# Patient Record
Sex: Male | Born: 1960 | Race: Black or African American | Hispanic: No | Marital: Married | Smoking: Former smoker
Health system: Southern US, Community
[De-identification: ages and names within clinical notes are randomized; demographics above are authoritative.]

## PROBLEM LIST (undated history)

## (undated) DIAGNOSIS — Z21 Asymptomatic human immunodeficiency virus [HIV] infection status: Secondary | ICD-10-CM

## (undated) DIAGNOSIS — B20 Human immunodeficiency virus [HIV] disease: Secondary | ICD-10-CM

## (undated) DIAGNOSIS — E785 Hyperlipidemia, unspecified: Secondary | ICD-10-CM

## (undated) HISTORY — DX: Asymptomatic human immunodeficiency virus (hiv) infection status: Z21

## (undated) HISTORY — DX: Hyperlipidemia, unspecified: E78.5

## (undated) HISTORY — DX: Human immunodeficiency virus (HIV) disease: B20

---

## 2001-06-28 ENCOUNTER — Ambulatory Visit (HOSPITAL_COMMUNITY): Admission: RE | Admit: 2001-06-28 | Discharge: 2001-06-28 | Payer: Self-pay | Admitting: General Surgery

## 2004-05-09 ENCOUNTER — Ambulatory Visit (HOSPITAL_COMMUNITY): Admission: RE | Admit: 2004-05-09 | Discharge: 2004-05-09 | Payer: Self-pay | Admitting: Family Medicine

## 2004-05-13 ENCOUNTER — Ambulatory Visit (HOSPITAL_COMMUNITY): Admission: RE | Admit: 2004-05-13 | Discharge: 2004-05-13 | Payer: Self-pay | Admitting: Family Medicine

## 2004-06-20 ENCOUNTER — Ambulatory Visit (HOSPITAL_COMMUNITY): Admission: RE | Admit: 2004-06-20 | Discharge: 2004-06-20 | Payer: Self-pay | Admitting: Thoracic Surgery

## 2004-06-28 ENCOUNTER — Ambulatory Visit: Payer: Self-pay | Admitting: Internal Medicine

## 2004-06-30 HISTORY — PX: COLONOSCOPY: SHX174

## 2004-07-01 ENCOUNTER — Ambulatory Visit: Payer: Self-pay | Admitting: Internal Medicine

## 2004-07-01 ENCOUNTER — Ambulatory Visit (HOSPITAL_COMMUNITY): Admission: RE | Admit: 2004-07-01 | Discharge: 2004-07-01 | Payer: Self-pay | Admitting: Internal Medicine

## 2004-08-05 ENCOUNTER — Ambulatory Visit: Payer: Self-pay | Admitting: Internal Medicine

## 2004-10-07 ENCOUNTER — Ambulatory Visit (HOSPITAL_COMMUNITY): Admission: RE | Admit: 2004-10-07 | Discharge: 2004-10-07 | Payer: Self-pay | Admitting: Family Medicine

## 2004-10-29 ENCOUNTER — Encounter (INDEPENDENT_AMBULATORY_CARE_PROVIDER_SITE_OTHER): Payer: Self-pay | Admitting: *Deleted

## 2004-10-29 ENCOUNTER — Encounter: Payer: Self-pay | Admitting: Internal Medicine

## 2004-10-29 DIAGNOSIS — B59 Pneumocystosis: Secondary | ICD-10-CM

## 2004-10-29 LAB — CONVERTED CEMR LAB
CD4 Count: 9 microliters
CD4 T Cell Abs: 9

## 2004-10-31 ENCOUNTER — Ambulatory Visit (HOSPITAL_COMMUNITY): Admission: RE | Admit: 2004-10-31 | Discharge: 2004-10-31 | Payer: Self-pay | Admitting: Family Medicine

## 2004-11-05 ENCOUNTER — Ambulatory Visit (HOSPITAL_COMMUNITY): Admission: RE | Admit: 2004-11-05 | Discharge: 2004-11-05 | Payer: Self-pay | Admitting: Family Medicine

## 2004-11-07 ENCOUNTER — Inpatient Hospital Stay (HOSPITAL_COMMUNITY): Admission: AD | Admit: 2004-11-07 | Discharge: 2004-11-15 | Payer: Self-pay | Admitting: Family Medicine

## 2004-12-28 ENCOUNTER — Ambulatory Visit: Payer: Self-pay | Admitting: Internal Medicine

## 2004-12-28 ENCOUNTER — Ambulatory Visit (HOSPITAL_COMMUNITY): Admission: RE | Admit: 2004-12-28 | Discharge: 2004-12-28 | Payer: Self-pay | Admitting: Internal Medicine

## 2005-01-24 ENCOUNTER — Ambulatory Visit: Payer: Self-pay | Admitting: Internal Medicine

## 2005-04-10 ENCOUNTER — Ambulatory Visit (HOSPITAL_COMMUNITY): Admission: RE | Admit: 2005-04-10 | Discharge: 2005-04-10 | Payer: Self-pay | Admitting: Internal Medicine

## 2005-04-10 ENCOUNTER — Ambulatory Visit: Payer: Self-pay | Admitting: Internal Medicine

## 2005-04-24 ENCOUNTER — Ambulatory Visit: Payer: Self-pay | Admitting: Internal Medicine

## 2005-05-18 IMAGING — CR DG CHEST 2V
2 series · 2 of 2 positions shown · non-contrast
Comparison: 2 view chest x-ray 11/05/2004.

CLINICAL DATA: Followup pneumonia.

CHEST - 2 VIEW  11/07/2004:

[view not recorded (1 of 2)]
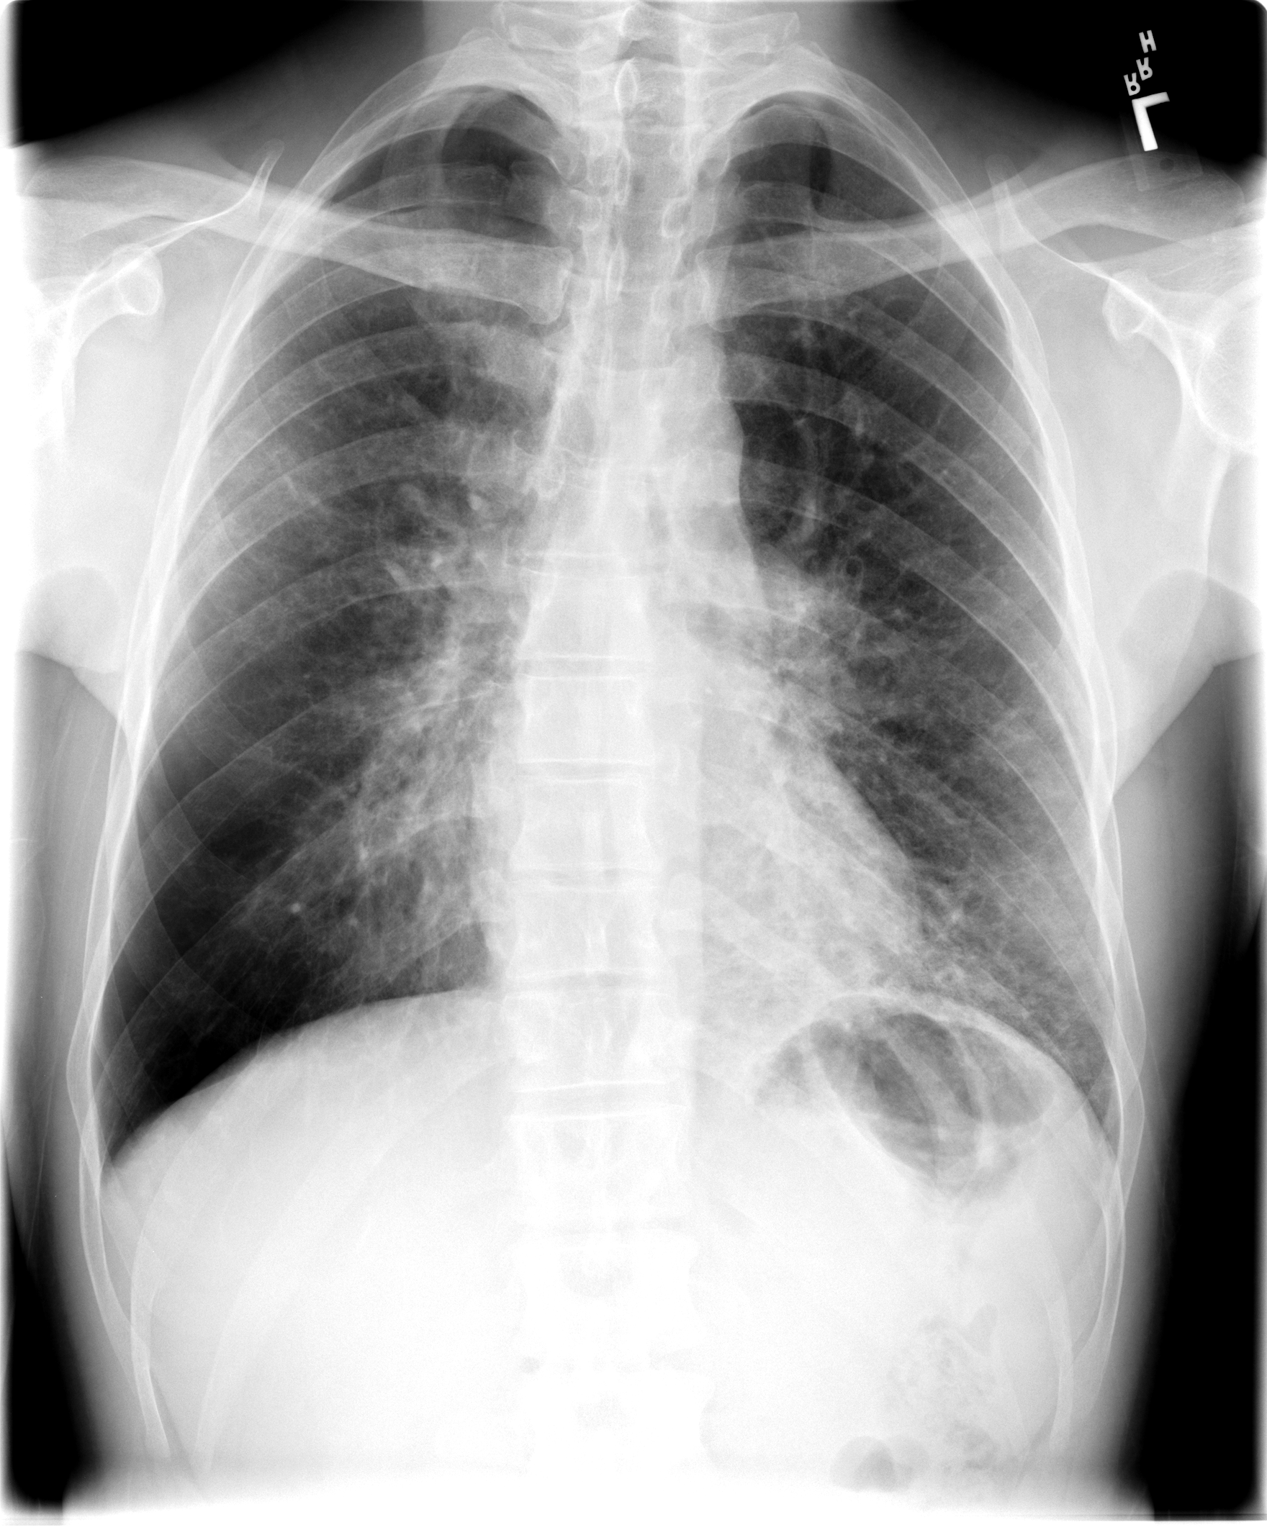

[view not recorded (2 of 2)]
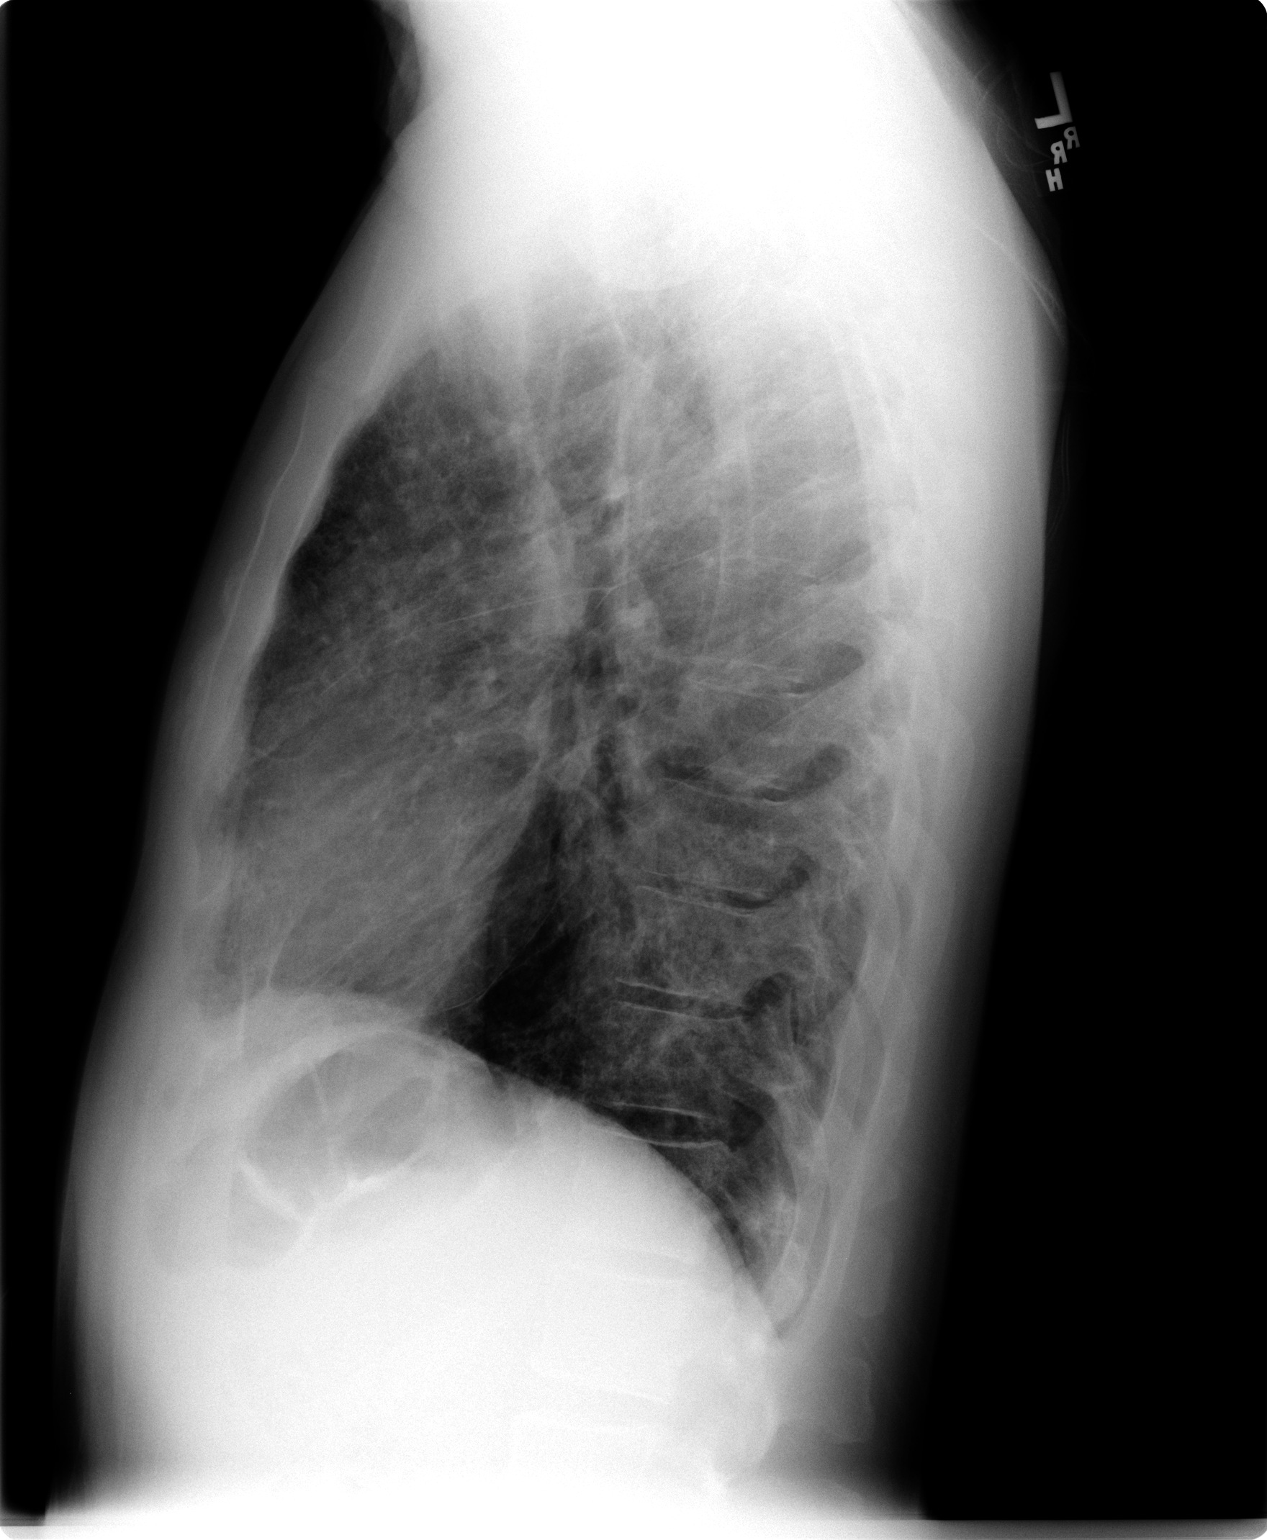

[2 of 2 positions shown; findings below may reference images not displayed]

FINDINGS: Since that time, there has been minimal clearing of the pneumonia in
the left lower lobe, though airspace opacities do persist. The extensive central
peribronchial thickening is unchanged. The cardiomediastinal silhouette is
unremarkable and is stable.
IMPRESSION: Minimal improvement in the left lower lobe pneumonia though air space opacities
persist. There has been no change in the severe diffuse bronchitis/asthma
pattern.

## 2005-05-23 IMAGING — CR DG CHEST 2V
2 series · 2 of 2 positions shown · non-contrast
Comparison: none

CLINICAL DATA: Pneumonia, follow-up chest.
 CHEST - 2 VIEW:
 Two views of the chest are compared to a chest x-ray of 11/07/04.   There is little change in opacity throughout the left lower lobe lingula and portion of the left upper lobe most consistent with pneumonia.   The right lung is clear.  No effusion is seen.  The heart is within normal limits in size.

[view not recorded (1 of 2)]
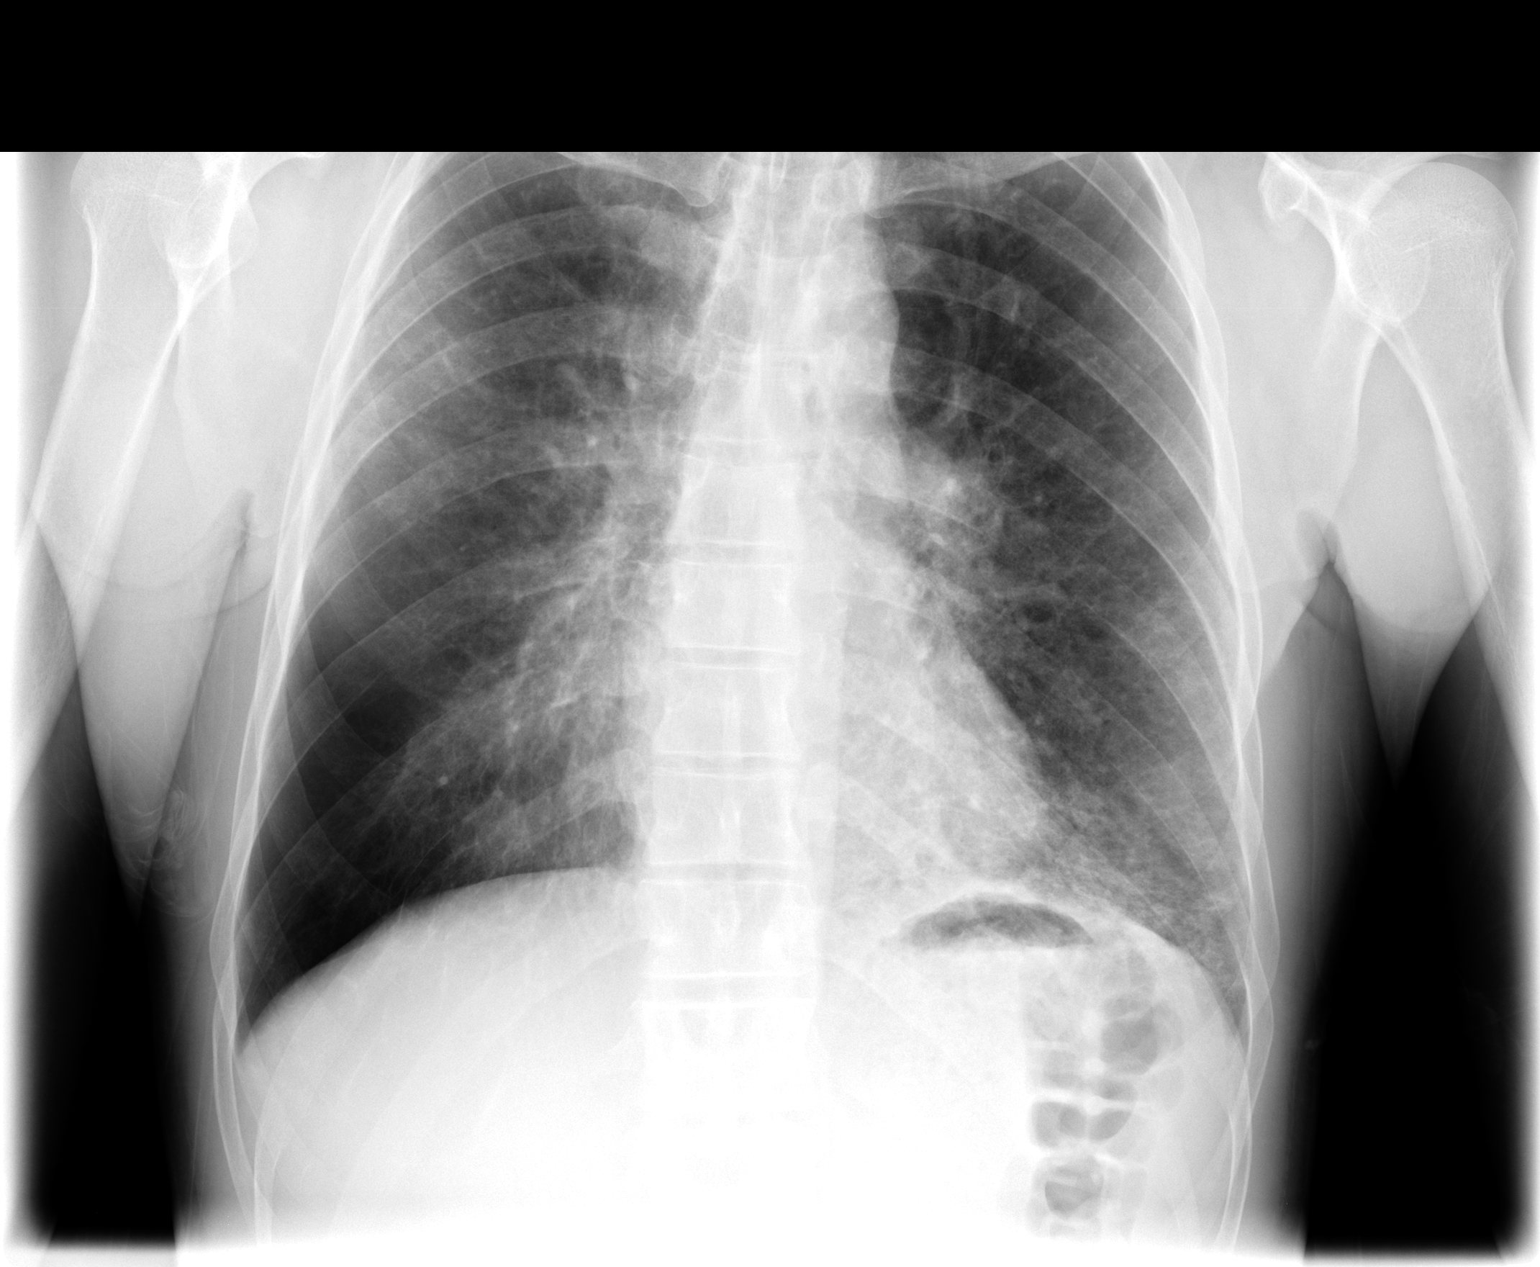

[view not recorded (2 of 2)]
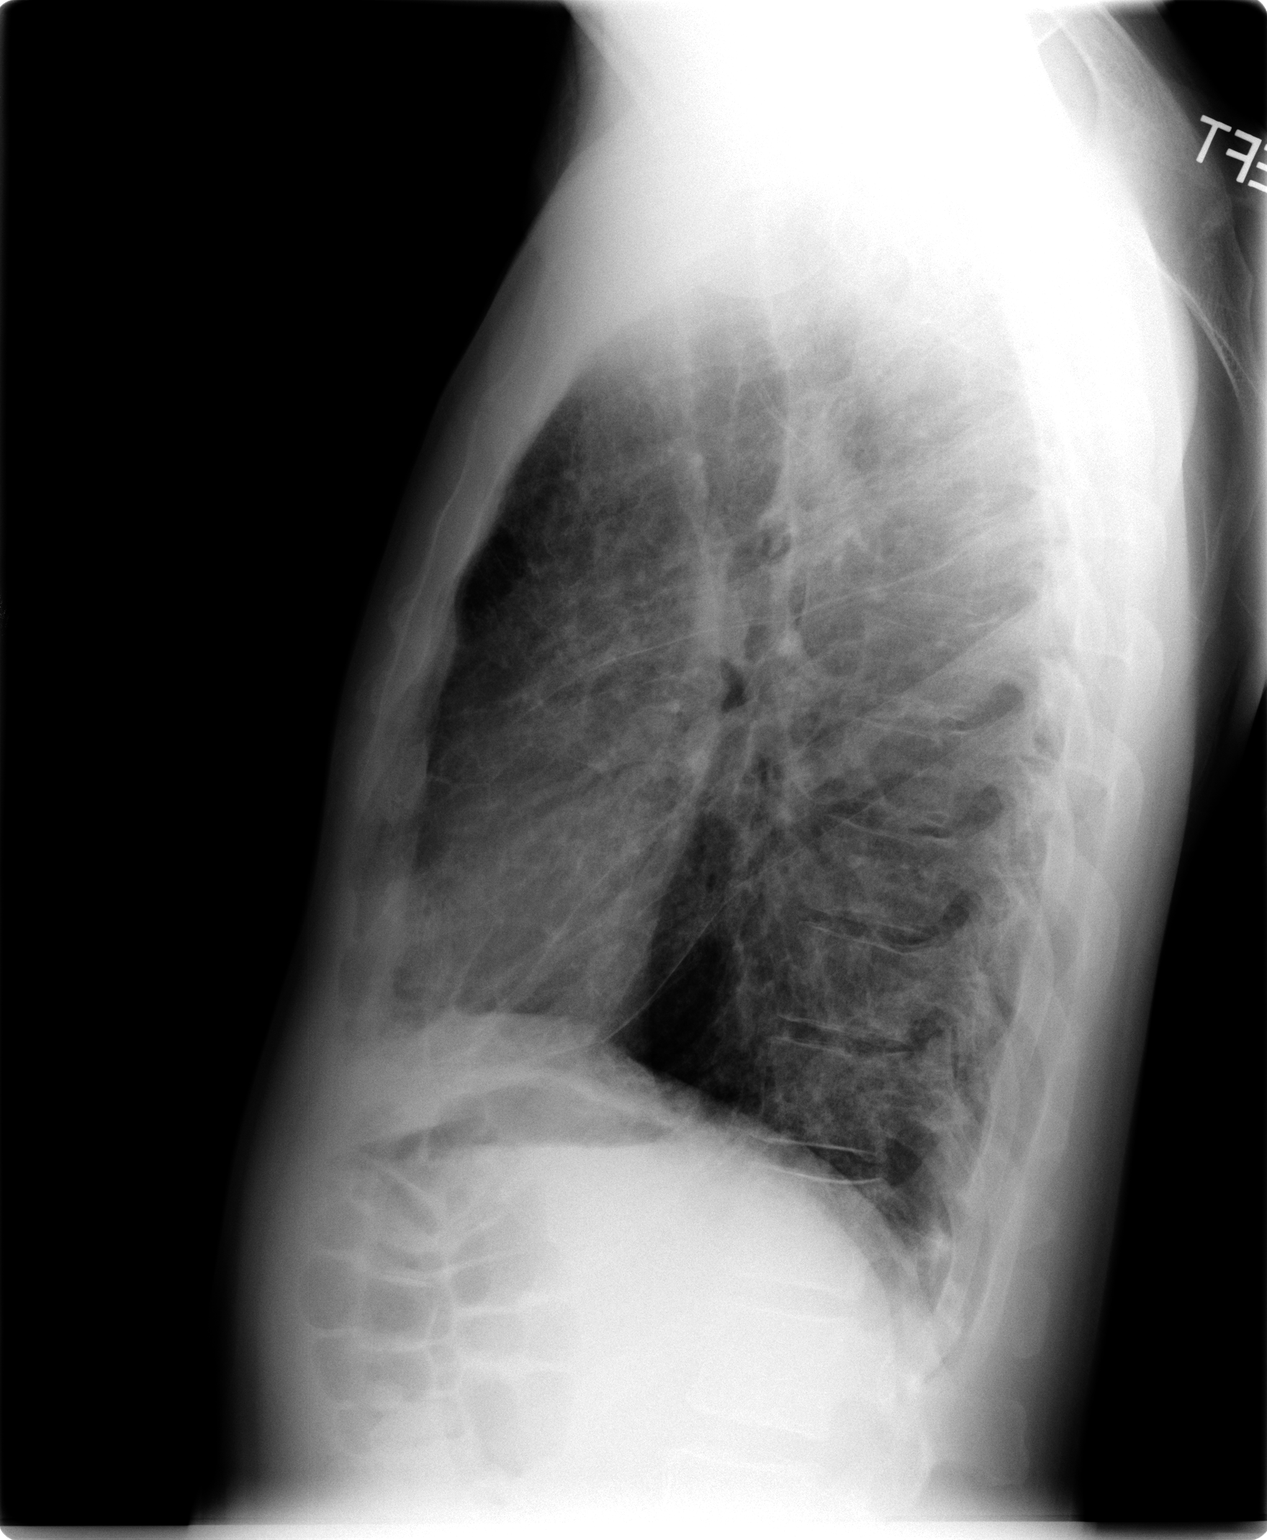

[2 of 2 positions shown; findings below may reference images not displayed]

IMPRESSION: No significant change in opacity in the left lung most consistent with pneumonia.

## 2005-05-24 ENCOUNTER — Ambulatory Visit: Payer: Self-pay | Admitting: Internal Medicine

## 2005-07-14 ENCOUNTER — Encounter (INDEPENDENT_AMBULATORY_CARE_PROVIDER_SITE_OTHER): Payer: Self-pay | Admitting: *Deleted

## 2005-07-14 ENCOUNTER — Ambulatory Visit (HOSPITAL_COMMUNITY): Admission: RE | Admit: 2005-07-14 | Discharge: 2005-07-14 | Payer: Self-pay | Admitting: Internal Medicine

## 2005-07-14 ENCOUNTER — Ambulatory Visit: Payer: Self-pay | Admitting: Internal Medicine

## 2005-07-14 LAB — CONVERTED CEMR LAB
CD4 Count: 110 microliters
HIV 1 RNA Quant: 399 copies/mL

## 2005-08-29 ENCOUNTER — Ambulatory Visit: Payer: Self-pay | Admitting: Internal Medicine

## 2005-12-28 ENCOUNTER — Ambulatory Visit: Payer: Self-pay | Admitting: Internal Medicine

## 2005-12-28 ENCOUNTER — Encounter: Admission: RE | Admit: 2005-12-28 | Discharge: 2005-12-28 | Payer: Self-pay | Admitting: Internal Medicine

## 2005-12-28 ENCOUNTER — Encounter (INDEPENDENT_AMBULATORY_CARE_PROVIDER_SITE_OTHER): Payer: Self-pay | Admitting: *Deleted

## 2005-12-28 LAB — CONVERTED CEMR LAB
CD4 Count: 180 microliters
HIV 1 RNA Quant: 399 copies/mL

## 2006-02-01 ENCOUNTER — Ambulatory Visit: Payer: Self-pay | Admitting: Internal Medicine

## 2006-07-26 ENCOUNTER — Ambulatory Visit: Payer: Self-pay | Admitting: Internal Medicine

## 2006-07-26 ENCOUNTER — Encounter (INDEPENDENT_AMBULATORY_CARE_PROVIDER_SITE_OTHER): Payer: Self-pay | Admitting: *Deleted

## 2006-07-26 ENCOUNTER — Encounter: Admission: RE | Admit: 2006-07-26 | Discharge: 2006-07-26 | Payer: Self-pay | Admitting: Internal Medicine

## 2006-07-26 LAB — CONVERTED CEMR LAB
CD4 Count: 290 microliters
HIV 1 RNA Quant: 64 copies/mL
HIV 1 RNA Quant: 64 copies/mL — ABNORMAL HIGH (ref <50)
HIV-1 RNA Quant, Log: 1.81 — ABNORMAL HIGH (ref <1.70)

## 2006-08-10 DIAGNOSIS — Z87891 Personal history of nicotine dependence: Secondary | ICD-10-CM

## 2006-08-10 DIAGNOSIS — E785 Hyperlipidemia, unspecified: Secondary | ICD-10-CM | POA: Insufficient documentation

## 2006-08-10 DIAGNOSIS — A630 Anogenital (venereal) warts: Secondary | ICD-10-CM | POA: Insufficient documentation

## 2006-08-10 DIAGNOSIS — B1910 Unspecified viral hepatitis B without hepatic coma: Secondary | ICD-10-CM | POA: Insufficient documentation

## 2006-08-10 DIAGNOSIS — B20 Human immunodeficiency virus [HIV] disease: Secondary | ICD-10-CM

## 2006-08-10 DIAGNOSIS — K029 Dental caries, unspecified: Secondary | ICD-10-CM | POA: Insufficient documentation

## 2006-08-29 ENCOUNTER — Ambulatory Visit: Payer: Self-pay | Admitting: Internal Medicine

## 2006-08-29 DIAGNOSIS — N62 Hypertrophy of breast: Secondary | ICD-10-CM | POA: Insufficient documentation

## 2006-09-14 ENCOUNTER — Encounter: Admission: RE | Admit: 2006-09-14 | Discharge: 2006-09-14 | Payer: Self-pay | Admitting: Internal Medicine

## 2006-09-24 ENCOUNTER — Encounter (INDEPENDENT_AMBULATORY_CARE_PROVIDER_SITE_OTHER): Payer: Self-pay | Admitting: *Deleted

## 2006-09-24 LAB — CONVERTED CEMR LAB

## 2006-10-07 ENCOUNTER — Encounter (INDEPENDENT_AMBULATORY_CARE_PROVIDER_SITE_OTHER): Payer: Self-pay | Admitting: *Deleted

## 2007-01-10 ENCOUNTER — Encounter: Admission: RE | Admit: 2007-01-10 | Discharge: 2007-01-10 | Payer: Self-pay | Admitting: Internal Medicine

## 2007-01-10 ENCOUNTER — Ambulatory Visit: Payer: Self-pay | Admitting: Internal Medicine

## 2007-01-11 ENCOUNTER — Encounter: Payer: Self-pay | Admitting: Internal Medicine

## 2007-01-11 LAB — CONVERTED CEMR LAB
HIV 1 RNA Quant: <50 copies/mL (ref <50)
HIV-1 RNA Quant, Log: <1.7 (ref <1.70)

## 2007-01-16 ENCOUNTER — Encounter (INDEPENDENT_AMBULATORY_CARE_PROVIDER_SITE_OTHER): Payer: Self-pay | Admitting: *Deleted

## 2007-01-22 ENCOUNTER — Telehealth: Payer: Self-pay | Admitting: Internal Medicine

## 2007-01-24 ENCOUNTER — Encounter (INDEPENDENT_AMBULATORY_CARE_PROVIDER_SITE_OTHER): Payer: Self-pay | Admitting: *Deleted

## 2007-01-24 ENCOUNTER — Ambulatory Visit: Payer: Self-pay | Admitting: Internal Medicine

## 2007-04-19 ENCOUNTER — Telehealth: Payer: Self-pay | Admitting: Internal Medicine

## 2007-06-07 ENCOUNTER — Ambulatory Visit: Payer: Self-pay | Admitting: Internal Medicine

## 2007-06-07 ENCOUNTER — Encounter: Admission: RE | Admit: 2007-06-07 | Discharge: 2007-06-07 | Payer: Self-pay | Admitting: Internal Medicine

## 2007-06-07 LAB — CONVERTED CEMR LAB
HIV 1 RNA Quant: 85 copies/mL — ABNORMAL HIGH (ref <50)
HIV-1 RNA Quant, Log: 1.93 — ABNORMAL HIGH (ref <1.70)

## 2007-06-20 ENCOUNTER — Ambulatory Visit: Payer: Self-pay | Admitting: Internal Medicine

## 2007-07-31 ENCOUNTER — Encounter (INDEPENDENT_AMBULATORY_CARE_PROVIDER_SITE_OTHER): Payer: Self-pay | Admitting: *Deleted

## 2007-10-11 ENCOUNTER — Encounter (INDEPENDENT_AMBULATORY_CARE_PROVIDER_SITE_OTHER): Payer: Self-pay | Admitting: *Deleted

## 2007-12-03 ENCOUNTER — Encounter: Admission: RE | Admit: 2007-12-03 | Discharge: 2007-12-03 | Payer: Self-pay | Admitting: Internal Medicine

## 2007-12-03 ENCOUNTER — Ambulatory Visit: Payer: Self-pay | Admitting: Internal Medicine

## 2007-12-03 LAB — CONVERTED CEMR LAB
ALT: 30 units/L (ref 0–53)
AST: 27 units/L (ref 0–37)
Albumin: 4.4 g/dL (ref 3.5–5.2)
Alkaline Phosphatase: 131 units/L — ABNORMAL HIGH (ref 39–117)
BUN: 13 mg/dL (ref 6–23)
CO2: 23 meq/L (ref 19–32)
Calcium: 9.2 mg/dL (ref 8.4–10.5)
Chloride: 107 meq/L (ref 96–112)
Cholesterol: 240 mg/dL — ABNORMAL HIGH (ref 0–200)
Creatinine, Ser: 1.15 mg/dL (ref 0.40–1.50)
Glucose, Bld: 97 mg/dL (ref 70–99)
HCT: 43.5 % (ref 39.0–52.0)
HDL: 43 mg/dL (ref >39)
HIV 1 RNA Quant: 97 copies/mL — ABNORMAL HIGH (ref <50)
HIV-1 RNA Quant, Log: 1.99 — ABNORMAL HIGH (ref <1.70)
Hemoglobin: 14.9 g/dL (ref 13.0–17.0)
MCHC: 34.3 g/dL (ref 30.0–36.0)
MCV: 95.2 fL (ref 78.0–100.0)
Platelets: 188 10*3/uL (ref 150–400)
Potassium: 4.2 meq/L (ref 3.5–5.3)
RBC: 4.57 M/uL (ref 4.22–5.81)
RDW: 14 % (ref 11.5–15.5)
Sodium: 142 meq/L (ref 135–145)
Total Bilirubin: 0.4 mg/dL (ref 0.3–1.2)
Total CHOL/HDL Ratio: 5.6
Total Protein: 7.3 g/dL (ref 6.0–8.3)
Triglycerides: 546 mg/dL — ABNORMAL HIGH (ref <150)
WBC: 4.4 10*3/uL (ref 4.0–10.5)

## 2007-12-24 ENCOUNTER — Ambulatory Visit: Payer: Self-pay | Admitting: Internal Medicine

## 2008-02-04 ENCOUNTER — Encounter (INDEPENDENT_AMBULATORY_CARE_PROVIDER_SITE_OTHER): Payer: Self-pay | Admitting: *Deleted

## 2008-03-13 ENCOUNTER — Encounter: Payer: Self-pay | Admitting: Internal Medicine

## 2008-05-08 ENCOUNTER — Ambulatory Visit: Payer: Self-pay | Admitting: Internal Medicine

## 2008-05-08 LAB — CONVERTED CEMR LAB
Cholesterol: 226 mg/dL — ABNORMAL HIGH (ref 0–200)
HDL: 43 mg/dL (ref >39)
HIV 1 RNA Quant: 109 copies/mL — ABNORMAL HIGH (ref <50)
HIV-1 RNA Quant, Log: 2.04 — ABNORMAL HIGH (ref <1.70)
LDL Cholesterol: 137 mg/dL — ABNORMAL HIGH (ref 0–99)
Total CHOL/HDL Ratio: 5.3
Triglycerides: 232 mg/dL — ABNORMAL HIGH (ref <150)
VLDL: 46 mg/dL — ABNORMAL HIGH (ref 0–40)

## 2008-05-27 ENCOUNTER — Telehealth (INDEPENDENT_AMBULATORY_CARE_PROVIDER_SITE_OTHER): Payer: Self-pay | Admitting: *Deleted

## 2008-05-27 ENCOUNTER — Ambulatory Visit: Payer: Self-pay | Admitting: Internal Medicine

## 2008-10-21 ENCOUNTER — Encounter (INDEPENDENT_AMBULATORY_CARE_PROVIDER_SITE_OTHER): Payer: Self-pay | Admitting: *Deleted

## 2008-11-04 ENCOUNTER — Ambulatory Visit: Payer: Self-pay | Admitting: Internal Medicine

## 2008-11-04 LAB — CONVERTED CEMR LAB
ALT: 27 units/L (ref 0–53)
AST: 21 units/L (ref 0–37)
Albumin: 4.5 g/dL (ref 3.5–5.2)
Alkaline Phosphatase: 134 units/L — ABNORMAL HIGH (ref 39–117)
BUN: 15 mg/dL (ref 6–23)
Basophils Absolute: 0 10*3/uL (ref 0.0–0.1)
Basophils Relative: 0 % (ref 0–1)
CO2: 23 meq/L (ref 19–32)
Calcium: 9.6 mg/dL (ref 8.4–10.5)
Chloride: 110 meq/L (ref 96–112)
Cholesterol: 249 mg/dL — ABNORMAL HIGH (ref 0–200)
Creatinine, Ser: 1 mg/dL (ref 0.40–1.50)
Eosinophils Absolute: 0.1 10*3/uL (ref 0.0–0.7)
Eosinophils Relative: 2 % (ref 0–5)
GFR calc Af Amer: >60 mL/min (ref >60)
GFR calc non Af Amer: >60 mL/min (ref >60)
Glucose, Bld: 101 mg/dL — ABNORMAL HIGH (ref 70–99)
HCT: 44.3 % (ref 39.0–52.0)
HDL: 48 mg/dL (ref >39)
HIV 1 RNA Quant: 73 copies/mL — ABNORMAL HIGH (ref <48)
HIV-1 RNA Quant, Log: 1.86 — ABNORMAL HIGH (ref <1.68)
Hemoglobin: 14.6 g/dL (ref 13.0–17.0)
LDL Cholesterol: 153 mg/dL — ABNORMAL HIGH (ref 0–99)
Lymphocytes Relative: 30 % (ref 12–46)
Lymphs Abs: 1.5 10*3/uL (ref 0.7–4.0)
MCHC: 33 g/dL (ref 30.0–36.0)
MCV: 93.3 fL (ref 78.0–100.0)
Monocytes Absolute: 0.4 10*3/uL (ref 0.1–1.0)
Monocytes Relative: 8 % (ref 3–12)
Neutro Abs: 3.1 10*3/uL (ref 1.7–7.7)
Neutrophils Relative %: 60 % (ref 43–77)
Platelets: 169 10*3/uL (ref 150–400)
Potassium: 4.6 meq/L (ref 3.5–5.3)
RBC: 4.75 M/uL (ref 4.22–5.81)
RDW: 14.1 % (ref 11.5–15.5)
Sodium: 144 meq/L (ref 135–145)
Total Bilirubin: 0.4 mg/dL (ref 0.3–1.2)
Total CHOL/HDL Ratio: 5.2
Total Protein: 7.4 g/dL (ref 6.0–8.3)
Triglycerides: 240 mg/dL — ABNORMAL HIGH (ref <150)
VLDL: 48 mg/dL — ABNORMAL HIGH (ref 0–40)
WBC: 5.1 10*3/uL (ref 4.0–10.5)

## 2008-11-23 ENCOUNTER — Ambulatory Visit: Payer: Self-pay | Admitting: Internal Medicine

## 2008-11-23 DIAGNOSIS — E669 Obesity, unspecified: Secondary | ICD-10-CM

## 2008-12-21 ENCOUNTER — Telehealth: Payer: Self-pay | Admitting: Internal Medicine

## 2009-02-09 ENCOUNTER — Encounter (INDEPENDENT_AMBULATORY_CARE_PROVIDER_SITE_OTHER): Payer: Self-pay | Admitting: *Deleted

## 2009-04-27 ENCOUNTER — Ambulatory Visit: Payer: Self-pay | Admitting: Internal Medicine

## 2009-04-27 LAB — CONVERTED CEMR LAB
HIV 1 RNA Quant: 192 copies/mL — ABNORMAL HIGH (ref <48)
HIV-1 RNA Quant, Log: 2.28 — ABNORMAL HIGH (ref <1.68)

## 2009-05-17 ENCOUNTER — Ambulatory Visit: Payer: Self-pay | Admitting: Internal Medicine

## 2009-09-30 ENCOUNTER — Ambulatory Visit: Payer: Self-pay | Admitting: Internal Medicine

## 2009-09-30 LAB — CONVERTED CEMR LAB
ALT: 18 units/L (ref 0–53)
AST: 19 units/L (ref 0–37)
Albumin: 4.3 g/dL (ref 3.5–5.2)
Alkaline Phosphatase: 116 units/L (ref 39–117)
BUN: 12 mg/dL (ref 6–23)
Basophils Absolute: 0 10*3/uL (ref 0.0–0.1)
Basophils Relative: 0 % (ref 0–1)
CO2: 25 meq/L (ref 19–32)
Calcium: 9.1 mg/dL (ref 8.4–10.5)
Chloride: 105 meq/L (ref 96–112)
Cholesterol: 241 mg/dL — ABNORMAL HIGH (ref 0–200)
Creatinine, Ser: 1.07 mg/dL (ref 0.40–1.50)
Eosinophils Absolute: 0.1 10*3/uL (ref 0.0–0.7)
Eosinophils Relative: 2 % (ref 0–5)
Glucose, Bld: 115 mg/dL — ABNORMAL HIGH (ref 70–99)
HCT: 44.4 % (ref 39.0–52.0)
HDL: 37 mg/dL — ABNORMAL LOW (ref >39)
HIV 1 RNA Quant: <48 copies/mL (ref <48)
HIV-1 RNA Quant, Log: <1.68 (ref <1.68)
Hemoglobin: 15 g/dL (ref 13.0–17.0)
Lymphocytes Relative: 25 % (ref 12–46)
Lymphs Abs: 1.5 10*3/uL (ref 0.7–4.0)
MCHC: 33.8 g/dL (ref 30.0–36.0)
MCV: 95.3 fL (ref >=78.0)
Monocytes Absolute: 0.5 10*3/uL (ref 0.1–1.0)
Monocytes Relative: 9 % (ref 3–12)
Neutro Abs: 4 10*3/uL (ref 1.7–7.7)
Neutrophils Relative %: 65 % (ref 43–77)
Platelets: 197 10*3/uL (ref 150–400)
Potassium: 4.6 meq/L (ref 3.5–5.3)
RBC: 4.66 M/uL (ref 4.22–5.81)
RDW: 14.2 % (ref 11.5–15.5)
Sodium: 140 meq/L (ref 135–145)
Total Bilirubin: 0.3 mg/dL (ref 0.3–1.2)
Total CHOL/HDL Ratio: 6.5
Total Protein: 6.9 g/dL (ref 6.0–8.3)
Triglycerides: 623 mg/dL — ABNORMAL HIGH (ref <150)
WBC: 6.1 10*3/uL (ref 4.0–10.5)

## 2009-10-12 ENCOUNTER — Ambulatory Visit: Payer: Self-pay | Admitting: Internal Medicine

## 2009-11-15 ENCOUNTER — Encounter (INDEPENDENT_AMBULATORY_CARE_PROVIDER_SITE_OTHER): Payer: Self-pay | Admitting: *Deleted

## 2009-12-10 ENCOUNTER — Ambulatory Visit: Payer: Self-pay | Admitting: Internal Medicine

## 2009-12-10 LAB — CONVERTED CEMR LAB
Cholesterol: 228 mg/dL — ABNORMAL HIGH (ref 0–200)
HDL: 44 mg/dL (ref >39)
HIV 1 RNA Quant: <48 copies/mL (ref <48)
HIV-1 RNA Quant, Log: <1.68 (ref <1.68)
LDL Cholesterol: 145 mg/dL — ABNORMAL HIGH (ref 0–99)
Total CHOL/HDL Ratio: 5.2
Triglycerides: 193 mg/dL — ABNORMAL HIGH (ref <150)
VLDL: 39 mg/dL (ref 0–40)

## 2010-01-11 ENCOUNTER — Ambulatory Visit: Payer: Self-pay | Admitting: Internal Medicine

## 2010-02-23 ENCOUNTER — Encounter (INDEPENDENT_AMBULATORY_CARE_PROVIDER_SITE_OTHER): Payer: Self-pay | Admitting: *Deleted

## 2010-06-21 ENCOUNTER — Encounter (INDEPENDENT_AMBULATORY_CARE_PROVIDER_SITE_OTHER): Payer: Self-pay | Admitting: *Deleted

## 2010-07-01 ENCOUNTER — Ambulatory Visit: Payer: Self-pay | Admitting: Internal Medicine

## 2010-07-01 LAB — CONVERTED CEMR LAB
ALT: 25 units/L (ref 0–53)
AST: 27 units/L (ref 0–37)
Albumin: 4.3 g/dL (ref 3.5–5.2)
Alkaline Phosphatase: 134 units/L — ABNORMAL HIGH (ref 39–117)
BUN: 13 mg/dL (ref 6–23)
CO2: 26 meq/L (ref 19–32)
Calcium: 9.1 mg/dL (ref 8.4–10.5)
Chloride: 107 meq/L (ref 96–112)
Cholesterol: 221 mg/dL — ABNORMAL HIGH (ref 0–200)
Creatinine, Ser: 1.08 mg/dL (ref 0.40–1.50)
Glucose, Bld: 148 mg/dL — ABNORMAL HIGH (ref 70–99)
HDL: 49 mg/dL (ref >39)
HIV 1 RNA Quant: <20 copies/mL (ref <20)
HIV-1 RNA Quant, Log: <1.3 (ref <1.30)
LDL Cholesterol: 134 mg/dL — ABNORMAL HIGH (ref 0–99)
Potassium: 4.5 meq/L (ref 3.5–5.3)
Sodium: 141 meq/L (ref 135–145)
Total Bilirubin: 0.4 mg/dL (ref 0.3–1.2)
Total CHOL/HDL Ratio: 4.5
Total Protein: 6.7 g/dL (ref 6.0–8.3)
Triglycerides: 191 mg/dL — ABNORMAL HIGH (ref <150)
VLDL: 38 mg/dL (ref 0–40)

## 2010-07-12 ENCOUNTER — Ambulatory Visit: Payer: Self-pay | Admitting: Internal Medicine

## 2010-07-12 DIAGNOSIS — L738 Other specified follicular disorders: Secondary | ICD-10-CM

## 2010-08-20 ENCOUNTER — Encounter: Payer: Self-pay | Admitting: Thoracic Surgery

## 2010-08-28 LAB — CONVERTED CEMR LAB
ALT: 21 units/L (ref 0–53)
AST: 19 units/L (ref 0–37)
Albumin: 4.3 g/dL (ref 3.5–5.2)
Alkaline Phosphatase: 141 units/L — ABNORMAL HIGH (ref 39–117)
BUN: 14 mg/dL (ref 6–23)
Basophils Absolute: 0 10*3/uL (ref 0.0–0.1)
Basophils Relative: 0 % (ref 0–1)
CD4 Count: 300 microliters
CO2: 23 meq/L (ref 19–32)
Calcium: 9.2 mg/dL (ref 8.4–10.5)
Chloride: 108 meq/L (ref 96–112)
Cholesterol: 233 mg/dL — ABNORMAL HIGH (ref 0–200)
Creatinine, Ser: 1.26 mg/dL (ref 0.40–1.50)
Eosinophils Absolute: 0.1 10*3/uL (ref 0.0–0.7)
Eosinophils Relative: 2 % (ref 0–5)
Glucose, Bld: 117 mg/dL — ABNORMAL HIGH (ref 70–99)
HCT: 44 % (ref 39.0–52.0)
HDL: 40 mg/dL (ref >39)
Hemoglobin: 14.8 g/dL (ref 13.0–17.0)
LDL Cholesterol: 114 mg/dL — ABNORMAL HIGH (ref 0–99)
Lymphocytes Relative: 36 % (ref 12–46)
Lymphs Abs: 1.3 10*3/uL (ref 0.7–3.3)
MCHC: 33.6 g/dL (ref 30.0–36.0)
MCV: 96.1 fL (ref 78.0–100.0)
Monocytes Absolute: 0.2 10*3/uL (ref 0.2–0.7)
Monocytes Relative: 7 % (ref 3–11)
Neutro Abs: 1.9 10*3/uL (ref 1.7–7.7)
Neutrophils Relative %: 55 % (ref 43–77)
Platelets: 180 10*3/uL (ref 150–400)
Potassium: 3.9 meq/L (ref 3.5–5.3)
RBC: 4.58 M/uL (ref 4.22–5.81)
RDW: 14.4 % — ABNORMAL HIGH (ref 11.5–14.0)
Sodium: 141 meq/L (ref 135–145)
Total Bilirubin: 0.3 mg/dL (ref 0.3–1.2)
Total CHOL/HDL Ratio: 5.8
Total Protein: 7.3 g/dL (ref 6.0–8.3)
Triglycerides: 395 mg/dL — ABNORMAL HIGH (ref <150)
VLDL: 79 mg/dL — ABNORMAL HIGH (ref 0–40)
WBC: 3.5 10*3/uL — ABNORMAL LOW (ref 4.0–10.5)

## 2010-08-30 NOTE — Miscellaneous (Signed)
Summary: clinical update/ryan white NCADAP apprv til 10/29/10  Clinical Lists Changes  Observations: Added new observation of AIDSDAP: Yes 2011 (11/15/2009 16:37)

## 2010-08-30 NOTE — Assessment & Plan Note (Signed)
Summary: BLOODWORK/SB.   CC:  follow-up visit.  History of Present Illness: Edwin [name] is in for his routine visit.  He continues to take Atripla and denies missing any doses.  He and his wife continue to be sexually active but always use condoms. He has been trying to modify his diet by eating less fried food but more fruits and vegetables.  He is surprised that he has gained weight since his last visit.  He gets no regular exercise.  Preventive Screening-Counseling & Management  Alcohol-Tobacco     Alcohol drinks/day: 0     Smoking Status: quit     Year Quit: 2006     Pack years: 0.25  Caffeine-Diet-Exercise     Caffeine use/day: yes     Does Patient Exercise: yes     Type of exercise: walking     Exercise (avg: min/session): <30     Times/week: <3  Hep-HIV-STD-Contraception     HIV Risk: no risk noted     HIV Risk Counseling: not indicated-no HIV risk noted  Safety-Violence-Falls     Seat Belt Use: yes  Comments: lgiven condoms      Sexual History:  currently monogamous.        Drug Use:  former.     Prior Medication List:  ATRIPLA 600-200-300 MG TABS (EFAVIRENZ-EMTRICITAB-TENOFOVIR) Take 1 tablet by mouth at bedtime   Current Allergies (reviewed today): No known allergies  Social History: Drug Use:  former  Vital Signs:  Patient profile:   50 year old male Height:      68 inches (172.72 cm) Weight:      195.6 pounds (88.91 kg) BMI:     29.85 Temp:     97.0 degrees F (36.11 degrees C) oral Pulse rate:   65 / minute BP sitting:   132 / 81  (left arm) Cuff size:   large  Vitals Entered By: Jennet Maduro RN (October 12, 2009 11:02 AM) CC: follow-up visit Is Patient Diabetic? No Pain Assessment Patient in pain? no      Nutritional Status BMI of 25 - 29 = overweight Nutritional Status Detail appetite "same"  Have you ever been in a relationship where you felt threatened, hurt or afraid?No   Does patient need assistance? Functional Status Self  care Ambulation Impaired:Risk for fall   Physical Exam  General:  alert and overweight-appearing.   Mouth:  pharynx pink and moist, no erythema, no exudates, and upper teeth missing.  good dentition.   Lungs:  normal breath sounds, no crackles, and no wheezes.   Heart:  normal rate, regular rhythm, and no murmur.   Psych:  normally interactive, good eye contact, not anxious appearing, and not depressed appearing.      Impression & Recommendations:  Problem # 1:  HIV INFECTION (ICD-042) His HIV infection remains under excellent control. Diagnostics Reviewed:  HIV: CDC-defined AIDS (03/13/2008)   CD4: 480 (10/01/2009)   WBC: 6.1 (09/30/2009)   Hgb: 15.0 (09/30/2009)   HCT: 44.4 (09/30/2009)   Platelets: 197 (09/30/2009) HIV-1 RNA: <48 copies/mL (09/30/2009)   HBSAg: NO (09/24/2006)  Orders: Est. Patient Level IV (99214)Future Orders: T-CD4SP ([facility]) (CD4SP) ... 01/10/2010 T-HIV Viral Load [phone]) ... 01/10/2010 T-Lipid Profile [phone]) ... 01/10/2010  Problem # 2:  OVERWEIGHT (ICD-278.02) I have encouraged him to try to limit portion size during his meals and to seek every opportunity to increase his physical activity.  He told me that he has been avoiding taking the stairs up and at work  but does take them down. Orders: Est. Patient Level IV (16109)  Problem # 3:  DYSLIPIDEMIA (ICD-272.4) I will start gemfibrozil and fish oil capsules. Labs Reviewed: SGOT: 19 (09/30/2009)   SGPT: 18 (09/30/2009)   HDL:37 (09/30/2009), 48 (11/04/2008)  LDL:* mg/dL (60/45/4098), 119 (14/78/2956)  Chol:241 (09/30/2009), 249 (11/04/2008)  Trig:623 (09/30/2009), 240 (11/04/2008)  His updated medication list for this problem includes:    Gemfibrozil 600 Mg Tabs (Gemfibrozil) .Marland Kitchen... Take 1 tablet by mouth two times a day  Orders: Est. Patient Level IV (21308)  Medications Added to Medication List This Visit: 1)  Gemfibrozil 600 Mg Tabs (Gemfibrozil) .... Take 1 tablet by mouth two  times a day 2)  Fish Oil 1000 Mg Caps (Omega-3 fatty acids) .... Take 3 capsules by mouth once a day  Patient Instructions: 1)  Please schedule a follow-up appointment in 3 months.  Process Orders Check Orders Results:     [facility]: ABN not required for this insurance Tests Sent for requisitioning (October 12, 2009 11:25 AM):     01/10/2010: [facility] -- T-HIV Viral Load [phone] (signed)     01/10/2010: [facility] -- T-Lipid Profile [phone] (signed)

## 2010-08-30 NOTE — Miscellaneous (Signed)
Summary: clinical update/ryan white  Clinical Lists Changes  Observations: Added new observation of PCTFPL: 175.37  (02/23/2010 11:00) Added new observation of HOUSEINCOME: 16109  (02/23/2010 11:00) Added new observation of FINASSESSDT: 02/23/2010  (02/23/2010 11:00) Added new observation of YEARLYEXPEN: 60454  (02/23/2010 11:00)

## 2010-08-30 NOTE — Miscellaneous (Signed)
  Clinical Lists Changes 

## 2010-08-30 NOTE — Assessment & Plan Note (Signed)
Summary: 48month f/u [mkj[   CC:  f/u.  History of Present Illness: Edwin [name] is in for his routine visit.  He denies missing a single dose of his Atripla since his last visit.  He did start fish oil capsules after his last visit but did not fill the gemfibrozil because he could not afford it.  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  Preventive Screening-Counseling & Management  Alcohol-Tobacco     Alcohol drinks/day: 0     Smoking Status: quit     Year Quit: 2006     Pack years: 0.25  Caffeine-Diet-Exercise     Caffeine use/day: yes     Does Patient Exercise: yes     Type of exercise: walking     Exercise (avg: min/session): <30     Times/week: <3  Hep-HIV-STD-Contraception     HIV Risk: no risk noted     HIV Risk Counseling: not indicated-no HIV risk noted  Safety-Violence-Falls     Seat Belt Use: yes  Comments: declined condoms      Sexual History:  currently monogamous.        Drug Use:  never.     Updated Prior Medication List: ATRIPLA 600-200-300 MG TABS (EFAVIRENZ-EMTRICITAB-TENOFOVIR) Take 1 tablet by mouth at bedtime FISH OIL 1000 MG CAPS (OMEGA-3 FATTY ACIDS) Take 3 capsules by mouth once a day  Current Allergies (reviewed today): No known allergies  Social History: Drug Use:  never  Vital Signs:  Patient profile:   50 year old male Height:      68 inches (172.72 cm) Weight:      193.75 pounds (88.07 kg) BMI:     29.57 Temp:     98.7 degrees F (37.06 degrees C) oral Pulse rate:   76 / minute BP sitting:   131 / 80  (left arm) Cuff size:   regular  Vitals Entered By: Jennet Maduro RN (January 11, 2010 11:08 AM) CC: f/u Is Patient Diabetic? No Pain Assessment Patient in pain? no      Nutritional Status BMI of 25 - 29 = overweight Nutritional Status Detail appetite "GOOD"  Have you ever been in a relationship where you felt  threatened, hurt or afraid?No   Does patient need assistance? Functional Status Self care Ambulation Normal Comments no missed doses   Physical Exam  General:  alert and overweight-appearing.   Mouth:  pharynx pink and moist, no erythema, no exudates, and upper teeth missing.  good dentition.   Lungs:  normal breath sounds, no crackles, and no wheezes.   Heart:  normal rate, regular rhythm, and no murmur.          Medication Adherence: 01/11/2010   Adherence to medications reviewed with patient. Counseling to provide adequate adherence provided   Prevention For Positives: 01/11/2010   Safe sex practices discussed with patient. Condoms offered.                              Impression & Recommendations:  Problem # 1:  HIV INFECTION (ICD-042) His adherence is excellent and his infection remains under very good control.  I will not make any changes today. Diagnostics Reviewed:  HIV: CDC-defined AIDS (03/13/2008)   CD4: 550 (12/10/2009)   WBC: 6.1 (09/30/2009)   Hgb: 15.0 (09/30/2009)   HCT:  44.4 (09/30/2009)   Platelets: 197 (09/30/2009) HIV-1 RNA: <48 copies/mL (12/10/2009)   HBSAg: NO (09/24/2006)  Orders: Est. Patient Level III (99213)Future Orders: T-CD4SP ([facility]) (CD4SP) ... 07/10/2010 T-HIV Viral Load [phone]) ... 07/10/2010 T-Comprehensive Metabolic Panel [id number]) ... 07/10/2010 T-Lipid Profile [id number]) ... 07/10/2010  Problem # 2:  DYSLIPIDEMIA (ICD-272.4) His hypertriglyceridemia has improved significantly.  I have agreed to wait and see if his LDL cholesterol comes down with a recent changes in his diet.  If not I will add a statin at the time of his next visit. The following medications were removed from the medication list:    Gemfibrozil 600 Mg Tabs (Gemfibrozil) .Marland Kitchen... Take 1 tablet by mouth two times a day  Orders: Est. Patient Level III (57846)  Other Orders: Pneumococcal Vaccine (96295) Admin 1st Vaccine (28413) Admin 1st Vaccine  [facility]) [phone])  Patient Instructions: 1)  Please schedule a follow-up appointment in 6 months.        Medication Adherence: 01/11/2010   Adherence to medications reviewed with patient. Counseling to provide adequate adherence provided    Prevention For Positives: 01/11/2010   Safe sex practices discussed with patient. Condoms offered.                             Pneumovax Vaccine    Vaccine Type: Pneumovax    Site: left deltoid    Mfr: Merck    Dose: 0.5 ml    Route: IM    Given by: Jennet Maduro RN    Exp. Date: 07/30/2011    Lot #: [id number]    VIS given: 02/26/96 version given January 11, 2010.

## 2010-09-01 NOTE — Assessment & Plan Note (Signed)
Summary: F/U/VS   CC:  follow-up visit.  History of Present Illness: General is in for his routine visit.  He never misses any doses of his Atripla. His only current problem is some itching in his groin which has been there for the past 3 weeks.  Preventive Screening-Counseling & Management  Alcohol-Tobacco     Alcohol drinks/day: 0     Smoking Status: quit     Year Quit: 2006     Pack years: 0.25  Caffeine-Diet-Exercise     Caffeine use/day: yes     Does Patient Exercise: yes     Type of exercise: walking     Exercise (avg: min/session): <30     Times/week: <3  Hep-HIV-STD-Contraception     HIV Risk: no risk noted     HIV Risk Counseling: not indicated-no HIV risk noted  Safety-Violence-Falls     Seat Belt Use: yes  Comments: declined condoms      Sexual History:  currently monogamous.        Drug Use:  never.     Prior Medication List:  ATRIPLA 600-200-300 MG TABS (EFAVIRENZ-EMTRICITAB-TENOFOVIR) Take 1 tablet by mouth at bedtime FISH OIL 1000 MG CAPS (OMEGA-3 FATTY ACIDS) Take 3 capsules by mouth once a day   Current Allergies (reviewed today): No known allergies  Vital Signs:  Patient profile:   50 year old male Height:      68 inches (172.72 cm) Weight:      188.75 pounds (85.80 kg) BMI:     28.80 Temp:     97.6 degrees F (36.44 degrees C) oral Pulse rate:   79 / minute BP sitting:   133 / 78  (left arm) Cuff size:   large  Vitals Entered By: Jennet Maduro RN (July 12, 2010 10:02 AM) CC: follow-up visit Is Patient Diabetic? No Pain Assessment Patient in pain? no      Nutritional Status BMI of 25 - 29 = overweight Nutritional Status Detail APPETITE "FINE"  Have you ever been in a relationship where you felt threatened, hurt or afraid?No   Does patient need assistance? Functional Status Self care Ambulation Normal Comments no missed doses of HIV rxes   Physical Exam  General:  alert and overweight-appearing.   Mouth:   pharynx pink and moist, no erythema, no exudates, and upper teeth missing.  good dentition.   Lungs:  normal breath sounds, no crackles, and no wheezes.   Heart:  normal rate, regular rhythm, and no murmur.   Genitalia:   he has numerous small follicular-based pustules in the groin        Medication Adherence: 07/12/2010   Adherence to medications reviewed with patient. Counseling to provide adequate adherence provided                                Impression & Recommendations:  Problem # 1:  HIV INFECTION (ICD-042) His infection remains under excellent control.  I will continue his current regimen. Diagnostics Reviewed:  HIV: CDC-defined AIDS (03/13/2008)   CD4: 420 (07/04/2010)   WBC: 6.1 (09/30/2009)   Hgb: 15.0 (09/30/2009)   HCT: 44.4 (09/30/2009)   Platelets: 197 (09/30/2009) HIV-1 RNA: <20 copies/mL (07/01/2010)   HBSAg: NO (09/24/2006)  Orders: Est. Patient Level III (99213)Future Orders: T-CD4SP ([facility]) (CD4SP) ... 01/08/2011 T-HIV Viral Load [phone]) ... 01/08/2011 T-Comprehensive Metabolic Panel [id number]) ... 01/08/2011 T-CBC w/Diff ([id number]) ... 01/08/2011 T-RPR (Syphilis) [id number]) ... 01/08/2011  T-Lipid Profile [id number]) ... 01/08/2011  Problem # 2:  FOLLICULITIS (ICD-704.8) I will treat his folliculitis with mupirocin cream. Orders: Est. Patient Level III (09811)  Medications Added to Medication List This Visit: 1)  Bactroban 2 % Crea (Mupirocin calcium) .... Apply to rash two times a day  Patient Instructions: 1)  Please schedule a follow-up appointment in 6 months.   Prescriptions: BACTROBAN 2 % CREA (MUPIROCIN CALCIUM) Apply to rash two times a day  #1 x 1   Entered and Authorized by:   Cliffton Asters MD   Signed by:   Cliffton Asters MD on 07/12/2010   Method used:   Print then Give to Patient   RxID:   [id number]     Appended Document: F/U/VS   Influenza Vaccine    Vaccine Type: Fluvax Non-MCR     Site: left deltoid    Mfr: novartis    Dose: 0.5 ml    Route: IM    Given by: Jennet Maduro RN    Exp. Date: 10/30/2010    Lot #: [id number]  Flu Vaccine Consent Questions    Do you have a history of severe allergic reactions to this vaccine? no    Any prior history of allergic reactions to egg and/or gelatin? no    Do you have a sensitivity to the preservative Thimersol? no    Do you have a past history of Guillan-Barre Syndrome? no    Do you currently have an acute febrile illness? no    Have you ever had a severe reaction to latex? no    Vaccine information given and explained to patient? yes

## 2010-09-30 ENCOUNTER — Encounter (INDEPENDENT_AMBULATORY_CARE_PROVIDER_SITE_OTHER): Payer: Self-pay | Admitting: *Deleted

## 2010-10-06 NOTE — Miscellaneous (Signed)
Summary: ADAP update   Clinical Lists Changes  Observations: Added new observation of PCTFPL: 88.12  (09/30/2010 11:56) Added new observation of HOUSEINCOME: 04540  (09/30/2010 11:56) Added new observation of AIDSDAP: Pending-approval 2012  (09/30/2010 11:56) Added new observation of YEARLYEXPEN: 0  (09/30/2010 11:56) Added new observation of FINASSESSDT: 09/29/2010  (09/30/2010 11:56)

## 2010-10-10 LAB — T-HELPER CELL (CD4) - (RCID CLINIC ONLY)
CD4 % Helper T Cell: 35 % (ref 33–55)
CD4 T Cell Abs: 420 uL (ref 400–2700)

## 2010-10-18 LAB — T-HELPER CELL (CD4) - (RCID CLINIC ONLY)
CD4 % Helper T Cell: 42 % (ref 33–55)
CD4 T Cell Abs: 550 uL (ref 400–2700)

## 2010-10-19 ENCOUNTER — Encounter (INDEPENDENT_AMBULATORY_CARE_PROVIDER_SITE_OTHER): Payer: Self-pay | Admitting: *Deleted

## 2010-10-23 LAB — T-HELPER CELL (CD4) - (RCID CLINIC ONLY)
CD4 % Helper T Cell: 35 % (ref 33–55)
CD4 T Cell Abs: 480 uL (ref 400–2700)

## 2010-10-27 NOTE — Miscellaneous (Signed)
Summary: ADAP APPROVED TIL 04/30/11  Clinical Lists Changes  Observations: Added new observation of AIDSDAP: Yes 2012 (10/19/2010 11:09)

## 2010-11-04 LAB — T-HELPER CELL (CD4) - (RCID CLINIC ONLY)
CD4 % Helper T Cell: 33 % (ref 33–55)
CD4 T Cell Abs: 550 uL (ref 400–2700)

## 2010-11-09 LAB — T-HELPER CELL (CD4) - (RCID CLINIC ONLY)
CD4 % Helper T Cell: 31 % — ABNORMAL LOW (ref 33–55)
CD4 T Cell Abs: 390 uL — ABNORMAL LOW (ref 400–2700)

## 2010-12-16 NOTE — Group Therapy Note (Signed)
NAMEOLUWATIMILEHIN, BALFOUR NO.:  [id number]   MEDICAL RECORD NO.:  [id number]          PATIENT TYPE:  INP   LOCATION:  A309                          FACILITY:  [facility]   PHYSICIAN:  Edward L. Juanetta Gosling, M.D.DATE OF BIRTH:  06-24-1961   DATE OF PROCEDURE:  11/15/2004  DATE OF DISCHARGE:                                   PROGRESS NOTE   I have discussed Mr. Bayley's situation with Dr. Gerda Diss, and Mr. Sabado is  better. His chest is much is clearer. He seems to be improving and  clinically is probably ready for discharge. I think it is reasonable to go  ahead and discharge him. I would suggest that he have another two to three  days of something like Augmentin to finish out a 10-day course since he has  been on Zosyn and have him finish out probably a month's worth of  therapeutic Bactrim and put him to suppressive dose. At this point, I am  going to plan to sign off. Thank you very much for allowing me to see him  with you.      ELH/MEDQ  D:  11/15/2004  T:  11/15/2004  Job:  [id number]

## 2010-12-16 NOTE — Consult Note (Signed)
[name], [name] NO.:  [id number]   MEDICAL RECORD NO.:  [id number]          PATIENT TYPE:  INP   LOCATION:  A309                          FACILITY:  [facility]   PHYSICIAN:  Edward L. Juanetta Gosling, M.D.DATE OF BIRTH:  09/25/1960   DATE OF CONSULTATION:  11/07/2004  DATE OF DISCHARGE:                                   CONSULTATION   REASON FOR CONSULTATION:  Pneumonia, bronchiectasis, and positive HIV.   HISTORY:  Edwin [name] is a 50 year old who says that he has been doing  fairly well and had had a workup in [place], but I do not have details of  that where he was eventually diagnosed as having bronchiectasis. He had an  episode of cough and congestion, was treated with antibiotics, and was noted  to have thrush. Because of this he underwent an HIV screen which was  positive. His past medical history otherwise, he says, is pretty much  negative. He does have osteoarthritis. He has had an episode of GI bleeding  and that was evaluated by Dr. Jena Gauss in November and December of last year.  Surgically, he has had a vasectomy. He has no known drug allergies.   FAMILY HISTORY:  His mother died with ovarian cancer. His father died in the  service. His grandfather had some sort of a lung problem that required him  to have a pneumonectomy.   SOCIAL HISTORY:  He works in an [employer]. He does not  drink any alcohol. He does not use any tobacco. He does not use any drugs.   REVIEW OF SYSTEMS:  He says his weight has been about the same for the last  several years. He has had no other infections that are opportunistic other  than the thrush. He does have a rash which feels like candida in his groin,  but no other opportunistic infections of any kind.   PHYSICAL EXAMINATION:  VITAL SIGNS: Temperature 100.2, pulse 126,  respirations 20, blood pressure 118/69, O2 saturation is 95%.  CHEST: Actually fairly clear despite having the pneumonia.  HEART: Regular  without gallop.  ABDOMEN: Soft.  EXTREMITIES: No edema.   LABWORK:  His blood gas on room air reveals PO2 of 64, PCO2 34, pH 7.47. CBC  shows white count of 6300, hemoglobin 10.9, platelet count 302,000.  BMET  normal with the exception of a glucose of 120. This is a nonfasting  specimen. His hepatic function shows an SGOT of 39, alkaline phosphatase 154  most likely high, albumin 2.2.   Chest x-ray shows left lower lobe pneumonia. It is not a lobar pattern. I  have discussed all this with Dr. Gerda Diss by phone earlier. Plan is to go  ahead and get routine studies including a CD-4 count, etc. We are going to  go ahead and treat him for pneumocystis and treat him with Zosyn.      ELH/MEDQ  D:  11/07/2004  T:  11/07/2004  Job:  [id number]

## 2010-12-16 NOTE — H&P (Signed)
[facility]  Patient:    Edwin [name], Edwin [name] Visit Number: [id number] MRN: [id number]          Service Type: Attending:  Elpidio Anis, M.D. Dictated by:   Elpidio Anis, M.D. Adm. Date:  06/28/01                           History and Physical  HISTORY:  A 50 year old male with two living children who requests bilateral vasectomy for permanent sterilization.  He has had no major medical problems.  PAST MEDICAL HISTORY:  No illnesses.  MEDICATIONS:  None.  PAST SURGICAL HISTORY:  None.  SOCIAL HISTORY:  He does not drink, smoke, or use drugs.  He is employed, married.  REVIEW OF SYSTEMS:  Entirely negative.  PHYSICAL EXAMINATION  GENERAL:  He is a healthy appearing male in no acute distress.  VITAL SIGNS:  Blood pressure 118/68, pulse 68, respirations 18, weight 158 pounds.  HEENT:  Without abnormality.  NECK:  Supple without JVD or bruit.  CHEST:  Clear to auscultation.  HEART:  Regular rate and rhythm without murmur, gallop, or rub.  ABDOMEN:  Soft, nontender.  No masses.  EXTREMITIES:  No cyanosis, clubbing, or edema.  NEUROLOGIC:  No focal motor, sensory, or cerebellar deficit.  GENITOURINARY:  Normal phallus with normal testicles.  Bilateral palpable vas deferens.  IMPRESSION:  Need for bilateral vasectomy for permanent sterilization.  PLAN:  Bilateral vasectomy. Dictated by:   Elpidio Anis, M.D. Attending:  Elpidio Anis, M.D. DD:  06/27/01 TD:  06/27/01 Job: [id number]

## 2010-12-16 NOTE — Discharge Summary (Signed)
NAMEBURLE, KWAN NO.:  [id number]   MEDICAL RECORD NO.:  [id number]          PATIENT TYPE:  INP   LOCATION:  A309                          FACILITY:  [facility]   PHYSICIAN:  Donna Bernard, M.D.DATE OF BIRTH:  [birth year]   DATE OF ADMISSION:  11/07/2004  DATE OF DISCHARGE:  04/18/2006LH                                 DISCHARGE SUMMARY   FINAL DIAGNOSES:  1.  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  2.  New diagnosis of acquired immunodeficiency syndrome.  3.  Thrush.   FINAL DISPOSITION:  1.  The patient discharged to home.  2.  Discharge medications:      1.  Bactrim DS 2 p.o. t.i.d. for 21 days.      2.  Augmentin 875 one b.i.d. for the next 3 days.      3.  Truvada 1 tablet each morning.      4.  Sustiva 200 mg capsule 3 p.o. at bedtime.      5.  The patient to finish Diflucan dosage from home.  3.  Exercise and proper nutrition encouraged.  4.  Followup in the office in one week.  5.  Will set up with AIDS clinic or HIV clinic in [place] with infectious      disease specialists.   INITIAL HISTORY AND PHYSICAL:  Please see H&P as dictated.   HOSPITAL COURSE:  This patient is a 50 year old black male with a history of  protracted chest infiltrates back in the winter that was diagnosed as  bronchiectasis by the chest surgeons in [place]. He returned to the  office recently with cough, congestion, and fever, and x-ray revealed  pneumonia. The patient was placed on oral Levaquin. He came back a week  later with no significant improvement with the emergence of thrush in his  mouth. Based on this along with recent weight loss, a HIV test was ordered.  This came back strongly positive. Due to persistent shortness of breath,  chills, and worsening clinical status, the patient was admitted to the  hospital. He was given IV trimethoprim and sulfamethoxazole at the  appropriate dose. Zosyn was also given. The patient was also given  Diflucan  200 mg daily for his thrush. I spoke with the ID specialist. I recommended  going ahead and initiating the proper antiviral medicine. His CD4 count came  back less than 10. His HIV quantitative count came back at greater than  100,000. We initiated Truvada 1 tablet each morning and Sustiva 200 mg  capsules 3 at bedtime. Due to hypoxia, oxygen was administered for the first  6 days of the hospitalization. Here on day 8, the patient is clinically  feeling much better. He is no longer having chills. He is breathing easier.  His chest sounds much better. His chest x-ray has not really improved much,  but this is not unusual for this condition. Today, the patient is discharged  home with diagnosis and disposition as noted above.      WSL/MEDQ  D:  11/15/2004  T:  11/15/2004  Job:  [id number]

## 2010-12-16 NOTE — H&P (Signed)
[name], [name] NO.:  [id number]   MEDICAL RECORD NO.:  [id number]          PATIENT TYPE:  INP   LOCATION:  A309                          FACILITY:  [facility]   PHYSICIAN:  Edwin [name], M.D.DATE OF BIRTH:  11-24-1960   DATE OF ADMISSION:  11/07/2004  DATE OF DISCHARGE:  LH                                HISTORY & PHYSICAL   CHIEF COMPLAINT:  Cough, fever, weakness, weight loss, feeling poorly.   SUBJECTIVE:  This patient is a 50 year old black male with a medical history  of recent protracted chest infiltrate diagnosed as bronchiectasis.  Please  see Past Medical History for further details.  The patient presented to the  office with cough, congestion, and fever.  An x-ray at that time revealed  pneumonia.  The patient was placed on oral Levaquin.  He returned several  days later with the emergence of thrush in his mouth. This, along with  weight loss in the last few months, led [place] to go ahead and order an HIV  test.  On Friday this came back positive.  The patient has been married for  10 years but admitted to some bisexual encounters in recent years on a  sporadic basis.  No history of IV drug use.  No history of transfusions.   The patient states over the weekend his cough has worsened.  His shortness  of breath has worsened.  He did smoke cigarettes up until November 2005 but  has quit.  He noticed some chills.  His appetite this weekend seemed to be  improving somewhat.  He has remained complaint with the Levaquin daily along  with Diflucan 200 mg daily which was initiated last week due to the positive  thrush.   MEDICAL HISTORY:  Significant for protracted infiltrate last fall which led  to diagnosis of bronchiectasis after a workup via Dr. Edwyna [name] including a PET  scan of the chest.   PAST SURGICAL HISTORY:  Vasectomy in November 2002.   FAMILY HISTORY:  Positive lung cancer, ovarian cancer.   ALLERGIES:  None known.   SOCIAL HISTORY:  The  patient is a Consulting civil engineer.  He is married and  smoked up until November.  Two children.  Occasional alcohol use.  No drug  use.   REVIEW OF SYSTEMS:  Otherwise negative.   PHYSICAL EXAMINATION:  VITAL SIGNS:  Temperature 99.7, weight 139 pounds.  Of note, weight in January 2005 was 154.  Blood pressure 120/60,  GENERAL:  The patient is alert and oriented with moderate malaise and  chills, wanting to lie down on the exam table due to weakness.  Hydration  adequate.  HEENT:  Thrush appears to be resolving.  TMs normal.  Pharynx normal other  than resolving thrush.  There is some atrophy of the fat pads in the cheeks  bilaterally.  NECK:  Supple.  LUNGS:  Fine crackles bilaterally, no wheezes.  Slight tachypnea with  exertion.  HEART:  Regular rate and rhythm.  ABDOMEN:  Soft, no obvious tenderness.  EXTREMITIES:  Thin.   SIGNIFICANT LABORATORY DATA AND OTHER STUDIES:  Chest x-ray:  Left  lower  lobe infiltrate with patchy opacities elsewhere.   CBC:  White blood count 6000, hemoglobin 10.3, platelets 302.  Differential  pending.  MET-7 normal.  Liver enzymes slightly elevated.  CD4 count  pending.   IMPRESSION:  Patchy persistent pneumonia in patient just diagnosed with  human immunodeficiency virus in the past week, minimally responsive to  outpatient Levaquin.  Certainly pneumocystis pneumonia is a high concern  with this patient's history.  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   PLAN:  1.  Consultation with Dr. Juanetta [name].  2.  IV Zosyn and Bactrim.  3.  Oxygen.  4.  Blood gases.  5.  Further orders on chart.      WSL/MEDQ  D:  11/07/2004  T:  11/07/2004  Job:  [id number]

## 2010-12-16 NOTE — Op Note (Signed)
NAME:  Edwin [name], Edwin [name] NO.:  [id number]   MEDICAL RECORD NO.:  [id number]          PATIENT TYPE:  AMB   LOCATION:  DAY                           FACILITY:  [facility]   PHYSICIAN:  R. Roetta Sessions, M.D. DATE OF BIRTH:  02-23-1961   DATE OF PROCEDURE:  07/01/2004  DATE OF DISCHARGE:                                 OPERATIVE REPORT   PROCEDURE:  Diagnostic colonoscopy.   INDICATION FOR PROCEDURE:  The patient is a 50 year old gentleman with  intermittent small-volume painless hematochezia, does have some anorectal  itching from time to time.  Colonoscopy is now being done.  This approach  has been discussed with the patient at length, the potential risks,  benefits, and alternatives have been reviewed, questions answered.  He is  agreeable.  Please see documentation in the medical record.   PROCEDURE NOTE:  O2 saturation, blood pressure, pulse, and respiration were  monitored throughout the entire procedure.   CONSCIOUS SEDATION:  Versed 5 mg IV, Demerol 100 mg IV in divided doses.   INSTRUMENT USED:  Olympus video chip system.   FINDINGS:  Digital rectal revealed somewhat lax anal sphincter tone,  otherwise negative.   Endoscopic findings:  The prep was adequate.   Rectum:  Examination of the rectal mucosa including retroflexed view of the  anal verge and en face view of the anal canal, demonstrated only internal  hemorrhoids.   Colon:  The colonic mucosa was surveyed from the rectosigmoid junction  through the left, transverse, and right colon to the area of the appendiceal  orifice, ileocecal valve, and cecum.  These structures were well-seen and  photographed for the record.  From this level the scope was slowly  withdrawn.  All previously-mentioned mucosal surfaces were again seen.  The  terminal ileum was intubated to 10 cm.  The colonic mucosa appeared normal,  as did the terminal ileum mucosa.  The patient tolerated the procedure well,  was reacted in  endoscopy.   IMPRESSION:  Anal canal/internal hemorrhoids, otherwise normal colon, normal  colon, normal terminal ileum.  I suspect the patient has had low-volume  hematochezia secondary to hemorrhoids.   RECOMMENDATIONS:  1.  Hemorrhoid literature provided to Mr. Dost.  2.  Course of AnaMantle cream applied to anorectum q.i.d. for the next five      days.  3.  Follow up with [place] in one month.     Otelia Sergeant   RMR/MEDQ  D:  07/01/2004  T:  07/01/2004  Job:  [id number]   cc:   Dr. Gerda Diss

## 2010-12-16 NOTE — Consult Note (Signed)
Edwin [name], ESKELSON NO.:  [id number]   MEDICAL RECORD NO.:  [id number]           PATIENT TYPE:   LOCATION:                                 FACILITY:   PHYSICIAN:  R. Roetta Sessions, M.D. DATE OF BIRTH:  1960-12-14   DATE OF CONSULTATION:  DATE OF DISCHARGE:                                   CONSULTATION   REASON FOR CONSULTATION:  Hematochezia, heme positive.   HISTORY OF PRESENT ILLNESS:  Edwin [name] is a 50 year old African-American  male sent over at the courtesy at Dr. Lubertha South to further evaluate  several month history of intermittent paper hematochezia. He has had some  associated burning and anal/rectal itching. He was reportedly hemoccult  positive through Dr. Fletcher Anon office as described by Mr. Oates. He has 1  to 2 bowel movements daily. Has not had any associated abdominal pain. No  upper GI tract symptoms such as odynophagia, dysphagia, early satiety,  reflux symptoms, nausea, or vomiting. No family history of colorectal  neoplasia. He has never had his lower GI tract imaged.   He has lost, he says, 20 or so pounds in the past year. 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 Over  the summer, he did have an abnormal chest x-ray which lead to a CT scan and  ultimately a PET scan and consultation with Dr. Edwyna Shell down in [place].  He is felt to have a benign inflammatory process to account for the x-ray  findings. No evidence of malignancy per his report.   PAST MEDICAL HISTORY:  Unremarkable aside from osteoarthritis for which he  takes nonsteroidals over the counter on occasion.   PAST SURGICAL HISTORY:  Vasectomy.   CURRENT MEDICATIONS:  Advil, Aleve, aspirin p.r.n.   ALLERGIES:  No known drug allergies.   FAMILY HISTORY:  Mother died with ovarian cancer. Father was killed in the  service. No history of chronic GI or liver illness.   SOCIAL HISTORY:  The patient is married and has 1 boy  and 1 girl. He is  employed with [employer]. No tobacco or alcohol.   REVIEW OF SYSTEMS:  As in history of present illness. No fever, chest pain,  or dyspnea.   PHYSICAL EXAMINATION:  GENERAL:  Was a pleasant 50 year old gentleman  resting comfortably. Weight 149, height 5 foot 8. Temperature 98.7, BP  118/70, pulse 72.  SKIN:  Warm and dry.  HEENT:  No sclerae icterus. Oral cavity no lesions. Dentition in poor state  of repair. JVD is not prominent.  CHEST:  Lungs are clear to auscultation.  HEART:  Regular rate and rhythm without murmurs, gallops, or rubs.  ABDOMEN:  Nondistended, positive bowel sounds, soft and nontender without  appreciable mass or organomegaly.  EXTREMITIES:  No edema.  RECTAL:  Deferred until the time of colonoscopy.   IMPRESSION:  Edwin [name] is a pleasant 50 year old gentleman with  intermittent small volume painless hematochezia. He does have some anorectal  itching from time to time. He reported he was Hemoccult positive in Dr.  Fletcher Anon  office.   He does cite significant weight loss over the past one year. It sounds like  he has had a pretty extensive pulmonary workup recently without significant  findings.   Mr. Bastin needs to have a colonoscopy. I have discussed the approach with  Mr. Bernabei at some length. Potential risks, benefits, and alternatives have  been reviewed and questions answered. Specifically talked about the risk of  perforation, bleeding, reaction to medications. All of his questions were  answered. He is agreeable. We will plan on performing colonoscopy in the  very near future. We will make further recommendations at that time.   I would like to thank Dr. Lubertha South for allowing me to see this nice  gentleman today.     Otelia Sergeant   RMR/MEDQ  D:  06/28/2004  T:  06/28/2004  Job:  [id number]   cc:   Donna Bernard, M.D.  [address]  [place]  [place] [address]  Fax: [phone]

## 2010-12-16 NOTE — Group Therapy Note (Signed)
NAMETRAVARUS, TRUDO NO.:  [id number]   MEDICAL RECORD NO.:  [id number]          PATIENT TYPE:  INP   LOCATION:  A309                          FACILITY:  [facility]   PHYSICIAN:  Edward L. Juanetta Gosling, M.D.DATE OF BIRTH:  08-12-1960   DATE OF PROCEDURE:  11/09/2004  DATE OF DISCHARGE:                                   PROGRESS NOTE   PROBLEM:  1.  Pneumonia.  2.  Positive Human immunodeficiency virus status.   SUBJECTIVE:  Mr. Kozma says he feels better and has no new complaints.   PHYSICAL EXAMINATION:  VITAL SIGNS:  His temperature has been as high as  102.4 in the last 24 hours, now 99.4, pulse 91, respirations 20, blood  pressure 109/67.  He says he feels better and indeed he does look better.   ASSESSMENT:  He is improving.   PLAN:  Continue with his treatments.  Continue with his medications.  No  changes.  I am going to continue to follow. I agree with Dr. Fletcher Anon  assessment that our plan should be to treat him until he is afebrile and  feeling better.      ELH/MEDQ  D:  11/09/2004  T:  11/10/2004  Job:  [id number]

## 2010-12-16 NOTE — Op Note (Signed)
[facility]  Patient:    Edwin [name], WIK Visit Number: [id number] MRN: [id number]          Service Type: DSU Location: DAY Attending Physician:  Dessa Phi Dictated by:   Elpidio Anis, M.D. Proc. Date: 06/28/01 Admit Date:  06/28/2001                             Operative Report  PREOPERATIVE DIAGNOSIS:  Desire for permanent sterilization.  POSTOPERATIVE DIAGNOSIS:  Desire for permanent sterilization.  PROCEDURE:  Bilateral vasectomy.  SURGEON:  Elpidio Anis, M.D.  DESCRIPTION OF PROCEDURE:  Under general LMA, the scrotum was prepped and draped in a sterile field.  The vas deferens on the left was isolated, incised, and a large segment was doubly clipped and excised.  Double suture ligation in the proximal and distal ends was carried out with 3-0 Bio-Sorb.  The lumen was cauterized on each side.  Hemostasis was achieved. The subcutaneous tissue and skin were closed with 3-0 Dexon.  A similar procedure was done on the opposite side.  A large segment was removed from each side.  The patient tolerated the procedure well.  A bulky dressing was placed.  He was transferred to a bed and taken to the post anesthesia care unit. Dictated by:   Elpidio Anis, M.D. Attending Physician:  Dessa Phi DD:  06/28/01 TD:  06/28/01 Job: [id number]

## 2010-12-20 ENCOUNTER — Other Ambulatory Visit: Payer: Self-pay

## 2010-12-20 ENCOUNTER — Other Ambulatory Visit: Payer: Self-pay | Admitting: Internal Medicine

## 2010-12-20 ENCOUNTER — Other Ambulatory Visit (INDEPENDENT_AMBULATORY_CARE_PROVIDER_SITE_OTHER): Payer: Self-pay

## 2010-12-20 DIAGNOSIS — B20 Human immunodeficiency virus [HIV] disease: Secondary | ICD-10-CM

## 2010-12-20 DIAGNOSIS — Z79899 Other long term (current) drug therapy: Secondary | ICD-10-CM

## 2010-12-20 DIAGNOSIS — Z113 Encounter for screening for infections with a predominantly sexual mode of transmission: Secondary | ICD-10-CM

## 2010-12-21 ENCOUNTER — Telehealth: Payer: Self-pay | Admitting: *Deleted

## 2010-12-21 LAB — CBC WITH DIFFERENTIAL/PLATELET
Basophils Absolute: 0 10*3/uL (ref 0.0–0.1)
Lymphocytes Relative: 28 % (ref 12–46)
Lymphs Abs: 1.4 10*3/uL (ref 0.7–4.0)
Neutro Abs: 3.2 10*3/uL (ref 1.7–7.7)
Platelets: 182 10*3/uL (ref 150–400)
RBC: 4.46 MIL/uL (ref 4.22–5.81)
RDW: 14.2 % (ref 11.5–15.5)
WBC: 5.1 10*3/uL (ref 4.0–10.5)

## 2010-12-21 LAB — T-HELPER CELL (CD4) - (RCID CLINIC ONLY): CD4 % Helper T Cell: 45 % (ref 33–55)

## 2010-12-21 LAB — COMPLETE METABOLIC PANEL WITH GFR
AST: 22 U/L (ref 0–37)
Albumin: 4.5 g/dL (ref 3.5–5.2)
Alkaline Phosphatase: 128 U/L — ABNORMAL HIGH (ref 39–117)
BUN: 13 mg/dL (ref 6–23)
Calcium: 9.8 mg/dL (ref 8.4–10.5)
Creat: 1.21 mg/dL (ref 0.40–1.50)
GFR, Est Non African American: >60 mL/min (ref >60)
Glucose, Bld: 92 mg/dL (ref 70–99)
Potassium: 4.5 mEq/L (ref 3.5–5.3)

## 2010-12-21 LAB — RPR

## 2010-12-21 LAB — LIPID PANEL
Cholesterol: 242 mg/dL — ABNORMAL HIGH (ref 0–200)
HDL: 39 mg/dL — ABNORMAL LOW (ref >39)
Triglycerides: 474 mg/dL — ABNORMAL HIGH (ref <150)

## 2010-12-21 LAB — HIV-1 RNA QUANT-NO REFLEX-BLD: HIV 1 RNA Quant: <20 copies/mL (ref <20)

## 2010-12-21 NOTE — Telephone Encounter (Signed)
States he has been coughing x 1 month. Also has runny nose. Cannot come in today. Will see NP tomorrow. Prefers pm appt. Will come in at 4

## 2010-12-22 ENCOUNTER — Ambulatory Visit (INDEPENDENT_AMBULATORY_CARE_PROVIDER_SITE_OTHER): Payer: Self-pay | Admitting: Adult Health

## 2010-12-22 ENCOUNTER — Encounter: Payer: Self-pay | Admitting: Adult Health

## 2010-12-22 VITALS — BP 145/87 | HR 92 | Temp 97.8°F | Ht 68.0 in | Wt 200.8 lb

## 2010-12-22 DIAGNOSIS — J310 Chronic rhinitis: Secondary | ICD-10-CM

## 2010-12-22 DIAGNOSIS — J45909 Unspecified asthma, uncomplicated: Secondary | ICD-10-CM

## 2010-12-22 NOTE — Progress Notes (Signed)
  Subjective:    Patient ID: Edwin [name], male    DOB: 03-Jun-1961, 50 y.o.   MRN: [id number]  HPI Presents to clinic today with complaints of three-week history of sinus congestion, sinus drainage, and progressively irritating cough. Denies chest tightness, wheezing, shortness of breath, or dyspnea on exertion. States he has tried Sudafed, Mucinex, Tussionex DM, and Chlor-Trimeton without any benefit. States coughing has been irritating enough that he is now having left-sided pleuritic chest pain. Denies fever, chills, or sweats.   Review of Systems  Constitutional: Positive for fatigue. Negative for fever, chills and diaphoresis.  HENT: Positive for congestion, rhinorrhea, sneezing, postnasal drip and sinus pressure. Negative for trouble swallowing and voice change.   Eyes: Negative for redness and itching.  Respiratory: Negative for cough, chest tightness, shortness of breath, wheezing and stridor.   Cardiovascular: Negative.   Gastrointestinal: Negative.   Genitourinary: Negative.   Neurological: Negative.   Hematological: Negative.        Objective:   Physical Exam  Constitutional: He is oriented to person, place, and time. He appears well-developed and well-nourished. No distress.  HENT:  Head: Normocephalic and atraumatic.  Right Ear: External ear normal.  Left Ear: External ear normal.  Mouth/Throat: Oropharynx is clear and moist.       Rhinorrhea noted. Postnasal drainage noted  Eyes: Conjunctivae and EOM are normal. Pupils are equal, round, and reactive to light.  Neck: Normal range of motion. Neck supple.  Cardiovascular: Normal rate and regular rhythm.   Pulmonary/Chest: Effort normal.       Coarse breath sounds were noted in the right upper anterior chest field but cleared with cough. Lungs otherwise were clear to auscultation.  Abdominal: Soft. Bowel sounds are normal.  Musculoskeletal: Normal range of motion.  Neurological: He is alert and oriented to person,  place, and time.  Skin: Skin is warm and dry. He is not diaphoretic.             Include, but are so tired  Assessment & Plan:  1. Rhinitis with Allergic Reactive Bronchitis. Stop the Sudafed, Chlor-Trimeton, and Tussionex DM. Start cetirizine, 10 mg by mouth daily, change in, Mucinex to Mucinex DM per package instructions, saline nasal spray to each nostrils frequently to route the day, increase oral fluid intake, diphenhydramine, 25 mg by mouth each bedtime, humidification in the home, especially at bedtime. Informed that if the symptoms do not improve. He may require nasal steroids. Also instructed to contact the clinic if he develops wheezing, chest tightness, shortness of breath, or fever. Verbally acknowledged all information and agreed with plan of care.

## 2010-12-22 NOTE — Patient Instructions (Signed)
1. Zyrtec (cetirizine), 10 mg one tablet by mouth daily. 2. Benadryl (diphenhydramine), 25 mg by mouth each bedtime. 3. Saline nasal last applying each nostril when necessary. 4. Continue using Mucinex, but try Mucinex DM to help suppress cough. 5. Humidification in the home, especially at at bedtime. 6. Increase fluid intake to help move secretions. 7. When the pollen count is elevated outside considerer staying indoors. 8. If fever develops, shortness of breath, and/or chest tightness, and wheezing, contact clinic. 9. If symptoms do not improve over the next couple weeks, it may be necessary to try nasal steroids.

## 2011-01-03 ENCOUNTER — Ambulatory Visit: Payer: Self-pay | Admitting: Internal Medicine

## 2011-01-03 ENCOUNTER — Ambulatory Visit (INDEPENDENT_AMBULATORY_CARE_PROVIDER_SITE_OTHER): Payer: Self-pay | Admitting: Internal Medicine

## 2011-01-03 ENCOUNTER — Encounter: Payer: Self-pay | Admitting: Internal Medicine

## 2011-01-03 DIAGNOSIS — E785 Hyperlipidemia, unspecified: Secondary | ICD-10-CM

## 2011-01-03 DIAGNOSIS — J309 Allergic rhinitis, unspecified: Secondary | ICD-10-CM

## 2011-01-03 DIAGNOSIS — B20 Human immunodeficiency virus [HIV] disease: Secondary | ICD-10-CM

## 2011-01-03 DIAGNOSIS — J302 Other seasonal allergic rhinitis: Secondary | ICD-10-CM | POA: Insufficient documentation

## 2011-01-03 NOTE — Progress Notes (Signed)
  Subjective:    Patient ID: Edwin [name], male    DOB: 1961/06/18, 50 y.o.   MRN: [id number]  HPI [name] is in for his routine visit. He does not recall missing a single dose of his Atripla since his last visit. He has been bothered by a chronic morning cough, some sinus congestion, and occasional itchy eyes. He saw my partner Martyn Malay last week and was put on Zyrtec and Mucinex. He is no longer taking the Mucinex but feels like the Zyrtec has helped. He stopped taking the fish oil capsules about 2 months ago because he said it made him have a slightly more frequent bowel movements but no diarrhea.    Review of Systems     Objective:   Physical Exam  Constitutional: No distress.  HENT:  Mouth/Throat: Oropharynx is clear and moist. No oropharyngeal exudate.  Cardiovascular: Normal rate, regular rhythm and normal heart sounds.   No murmur heard. Pulmonary/Chest: Breath sounds normal. He has no wheezes. He has no rales.  Psychiatric: He has a normal mood and affect.          Assessment & Plan:

## 2011-01-03 NOTE — Assessment & Plan Note (Signed)
His infection remains under excellent control. I will continue Atripla.

## 2011-01-03 NOTE — Assessment & Plan Note (Signed)
His triglyceride levels had come back up since stopping fish oil capsules. He agrees to restarting them today.

## 2011-01-03 NOTE — Assessment & Plan Note (Signed)
He probably has seasonal allergies and I will continue his Zyrtec.

## 2011-01-09 ENCOUNTER — Other Ambulatory Visit: Payer: Self-pay | Admitting: *Deleted

## 2011-01-09 DIAGNOSIS — B20 Human immunodeficiency virus [HIV] disease: Secondary | ICD-10-CM

## 2011-01-09 MED ORDER — EFAVIRENZ-EMTRICITAB-TENOFOVIR 600-200-300 MG PO TABS: 1.0000 | ORAL_TABLET | Freq: Every day | ORAL | Status: DC

## 2011-01-25 ENCOUNTER — Ambulatory Visit: Payer: Self-pay

## 2011-02-22 ENCOUNTER — Ambulatory Visit: Payer: Self-pay

## 2011-05-01 LAB — T-HELPER CELL (CD4) - (RCID CLINIC ONLY)
CD4 % Helper T Cell: 33
CD4 T Cell Abs: 360 — ABNORMAL LOW

## 2011-05-09 LAB — T-HELPER CELL (CD4) - (RCID CLINIC ONLY)
CD4 % Helper T Cell: 27 — ABNORMAL LOW
CD4 T Cell Abs: 290 — ABNORMAL LOW

## 2011-06-12 ENCOUNTER — Other Ambulatory Visit: Payer: Self-pay | Admitting: Internal Medicine

## 2011-06-12 ENCOUNTER — Other Ambulatory Visit (INDEPENDENT_AMBULATORY_CARE_PROVIDER_SITE_OTHER): Payer: Self-pay

## 2011-06-12 DIAGNOSIS — B20 Human immunodeficiency virus [HIV] disease: Secondary | ICD-10-CM

## 2011-06-12 DIAGNOSIS — E785 Hyperlipidemia, unspecified: Secondary | ICD-10-CM

## 2011-06-13 LAB — LIPID PANEL
Cholesterol: 228 mg/dL — ABNORMAL HIGH (ref 0–200)
LDL Cholesterol: 107 mg/dL — ABNORMAL HIGH (ref 0–99)
Total CHOL/HDL Ratio: 5.2 Ratio
Triglycerides: 385 mg/dL — ABNORMAL HIGH (ref <150)
VLDL: 77 mg/dL — ABNORMAL HIGH (ref 0–40)

## 2011-06-13 LAB — T-HELPER CELL (CD4) - (RCID CLINIC ONLY): CD4 % Helper T Cell: 41 % (ref 33–55)

## 2011-06-14 LAB — HIV-1 RNA QUANT-NO REFLEX-BLD

## 2011-06-26 ENCOUNTER — Encounter: Payer: Self-pay | Admitting: Internal Medicine

## 2011-06-26 ENCOUNTER — Ambulatory Visit (INDEPENDENT_AMBULATORY_CARE_PROVIDER_SITE_OTHER): Payer: Self-pay | Admitting: Internal Medicine

## 2011-06-26 VITALS — BP 157/81 | HR 73 | Temp 98.5°F | Ht 68.0 in | Wt 207.0 lb

## 2011-06-26 DIAGNOSIS — E785 Hyperlipidemia, unspecified: Secondary | ICD-10-CM

## 2011-06-26 DIAGNOSIS — E663 Overweight: Secondary | ICD-10-CM

## 2011-06-26 DIAGNOSIS — B20 Human immunodeficiency virus [HIV] disease: Secondary | ICD-10-CM

## 2011-06-26 DIAGNOSIS — Z23 Encounter for immunization: Secondary | ICD-10-CM

## 2011-06-26 NOTE — Assessment & Plan Note (Signed)
I talked to him about lifestyle modification to help control his evolving metabolic syndrome.

## 2011-06-26 NOTE — Progress Notes (Signed)
  Subjective:    Patient ID: Edwin [name], male    DOB: [birth year], 50 y.o.   MRN: [id number]  HPI Edwin [name] is in for his routine visit. He denies missing any doses of his Atripla. He restarted his fish oil supplements after his last visit but is only taking it Monday through Friday. He has no problems tolerating it. He does not get any regular exercise and does not follow any specific diet. He states that he is interested in applying for life insurance and wants to know what impact his HIV infection might have on that.    Review of Systems     Objective:   Physical Exam  Constitutional: No distress.       He's gained 8 pounds since his last visit.  HENT:  Mouth/Throat: Oropharynx is clear and moist. No oropharyngeal exudate.  Cardiovascular: Normal rate, regular rhythm and normal heart sounds.   No murmur heard. Pulmonary/Chest: Breath sounds normal. He has no wheezes. He has no rales.  Abdominal: Soft. There is no tenderness. There is no rebound.  Skin: No rash noted.  Psychiatric: He has a normal mood and affect.   HIV 1 RNA Quant (copies/mL)  Date Value  06/12/2011 NOT DETECTED   12/20/2010 <20   07/01/2010 <20 copies/mL      CD4 T Cell Abs (cmm)  Date Value  06/12/2011 720   12/20/2010 640   07/01/2010 420              Assessment & Plan:

## 2011-06-26 NOTE — Assessment & Plan Note (Signed)
His cholesterol and triglycerides have improved recently but are still not at goal. I have asked him to take his fish oil supplements 7 days a week and to research and American Heart Association, heart healthy diet. He also needs to get regular exercise.

## 2011-06-26 NOTE — Assessment & Plan Note (Signed)
His infection remains under excellent control. I will continue his current regimen. 

## 2011-08-16 ENCOUNTER — Other Ambulatory Visit: Payer: Self-pay | Admitting: *Deleted

## 2011-08-16 DIAGNOSIS — B20 Human immunodeficiency virus [HIV] disease: Secondary | ICD-10-CM

## 2011-08-16 MED ORDER — EFAVIRENZ-EMTRICITAB-TENOFOVIR 600-200-300 MG PO TABS: 1.0000 | ORAL_TABLET | Freq: Every day | ORAL | Status: DC

## 2011-08-29 ENCOUNTER — Other Ambulatory Visit: Payer: Self-pay | Admitting: Licensed Clinical Social Worker

## 2011-08-29 NOTE — Telephone Encounter (Signed)
Patient called stating he has rash in the groin area, same rash that he had at his visit 07/12/2010 with Dr. Orvan Falconer. He was prescribed bactroban 2% cream and would like it refilled. There are no available appointments this week.

## 2011-08-30 NOTE — Telephone Encounter (Signed)
He may have 1 refill of bactroban.

## 2011-09-13 ENCOUNTER — Ambulatory Visit: Payer: Self-pay

## 2011-09-21 ENCOUNTER — Telehealth: Payer: Self-pay | Admitting: *Deleted

## 2011-09-21 NOTE — Telephone Encounter (Signed)
States he has had an ongoing itchy rash in his groin area. Used a creme "years ago" per chart it was [place] & md approved 1 fill. He does not want to come to [place] as gas is high & he has to get his children after he gets off work at 2. States his skin is very dry & itchy there. I suggested he use an anti-itch creme (ask his pharmacist) and make sure to use lotion or vaseline to dry skin 3-4 times a day. To call next week if no improvement. Call if skin breaks or pud filled bumps. Pt agreed with plan

## 2011-09-27 ENCOUNTER — Telehealth: Payer: Self-pay | Admitting: *Deleted

## 2011-09-27 NOTE — Telephone Encounter (Signed)
He had called a few days ago aboput an itchy rash & wanting meds that he got a year or 2 ago. He has tried benadryl which has helped some. I advised he should let a doctor look at it & rx an appropriate creme or med. He agreed. I transferred him to [facility] who will schedule him with md

## 2011-10-03 ENCOUNTER — Ambulatory Visit: Payer: Self-pay | Admitting: Infectious Disease

## 2011-11-03 ENCOUNTER — Other Ambulatory Visit: Payer: Self-pay | Admitting: *Deleted

## 2011-11-03 DIAGNOSIS — Z113 Encounter for screening for infections with a predominantly sexual mode of transmission: Secondary | ICD-10-CM

## 2011-12-20 ENCOUNTER — Other Ambulatory Visit: Payer: Self-pay

## 2011-12-21 ENCOUNTER — Other Ambulatory Visit (INDEPENDENT_AMBULATORY_CARE_PROVIDER_SITE_OTHER): Payer: Self-pay

## 2011-12-21 DIAGNOSIS — Z113 Encounter for screening for infections with a predominantly sexual mode of transmission: Secondary | ICD-10-CM

## 2011-12-21 DIAGNOSIS — Z79899 Other long term (current) drug therapy: Secondary | ICD-10-CM

## 2011-12-21 DIAGNOSIS — B20 Human immunodeficiency virus [HIV] disease: Secondary | ICD-10-CM

## 2011-12-21 LAB — COMPREHENSIVE METABOLIC PANEL
Albumin: 4.4 g/dL (ref 3.5–5.2)
Alkaline Phosphatase: 122 U/L — ABNORMAL HIGH (ref 39–117)
CO2: 25 mEq/L (ref 19–32)
Calcium: 9.2 mg/dL (ref 8.4–10.5)
Chloride: 110 mEq/L (ref 96–112)
Glucose, Bld: 87 mg/dL (ref 70–99)
Potassium: 4.3 mEq/L (ref 3.5–5.3)
Sodium: 145 mEq/L (ref 135–145)
Total Protein: 6.7 g/dL (ref 6.0–8.3)

## 2011-12-21 LAB — LIPID PANEL
Cholesterol: 222 mg/dL — ABNORMAL HIGH (ref 0–200)
LDL Cholesterol: 132 mg/dL — ABNORMAL HIGH (ref 0–99)
Triglycerides: 236 mg/dL — ABNORMAL HIGH (ref <150)

## 2011-12-21 LAB — CBC
Hemoglobin: 14.1 g/dL (ref 13.0–17.0)
RBC: 4.42 MIL/uL (ref 4.22–5.81)
WBC: 5.4 10*3/uL (ref 4.0–10.5)

## 2011-12-22 LAB — HIV-1 RNA QUANT-NO REFLEX-BLD
HIV 1 RNA Quant: <20 copies/mL (ref <20)
HIV-1 RNA Quant, Log: <1.3 {Log} (ref <1.30)

## 2011-12-22 LAB — T-HELPER CELL (CD4) - (RCID CLINIC ONLY): CD4 % Helper T Cell: 42 % (ref 33–55)

## 2012-01-03 ENCOUNTER — Ambulatory Visit (INDEPENDENT_AMBULATORY_CARE_PROVIDER_SITE_OTHER): Payer: Self-pay | Admitting: Internal Medicine

## 2012-01-03 VITALS — BP 146/88 | HR 63 | Temp 98.1°F | Ht 68.0 in | Wt 208.5 lb

## 2012-01-03 DIAGNOSIS — B20 Human immunodeficiency virus [HIV] disease: Secondary | ICD-10-CM

## 2012-01-03 DIAGNOSIS — H9190 Unspecified hearing loss, unspecified ear: Secondary | ICD-10-CM

## 2012-01-03 DIAGNOSIS — H9191 Unspecified hearing loss, right ear: Secondary | ICD-10-CM | POA: Insufficient documentation

## 2012-01-03 NOTE — Progress Notes (Signed)
Patient ID: Edwin [name], male   DOB: [birth year], 51 y.o.   MRN: [id number]     [facility] for Infectious Disease  Patient Active Problem List  Diagnoses  . HIV INFECTION  . HEPATITIS B  . VENEREAL WART  . PNEUMOCYSTIS PNEUMONIA  . DYSLIPIDEMIA  . OVERWEIGHT  . DENTAL CARIES  . GYNECOMASTIA  . TOBACCO USE, QUIT  . FOLLICULITIS  . Seasonal allergies  . Hearing loss in right ear    Patient's Medications  New Prescriptions   No medications on file  Previous Medications   EFAVIRENZ-EMTRICTABINE-TENOFOVIR (ATRIPLA) 600-200-300 MG PER TABLET    Take 1 tablet by mouth at bedtime.   FISH OIL-OMEGA-3 FATTY ACIDS 1000 MG CAPSULE    Take 3 g by mouth daily.     LORATADINE (CLARITIN) 10 MG TABLET    Take 10 mg by mouth daily.  Modified Medications   No medications on file  Discontinued Medications   CETIRIZINE (ZYRTEC) 10 MG TABLET    Take 10 mg by mouth daily.      Subjective: Edwin [name] is in for his routine visit. He denies missing any doses of his Atripla since his last visit. He stopped taking his fish oil supplements after several months stating that he was trying to wean himself off of it. He states he restarted it 5 days ago. He is not having trouble tolerating the supplements. He states that he has started walking regularly for exercise.  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 He used a hearing aid when he was a teenager but has not had one for years.  Objective: Temp: 98.1 F (36.7 C) (06/05 1558) Temp src: Oral (06/05 1558) BP: 146/88 mmHg (06/05 1558) Pulse Rate: 63  (06/05 1558)  General: He is in good spirits Skin: No rash Ears: There is wax occluding the left tympanic membrane. The right is partially occluded but what I can see appears normal. Lungs: Clear Cor: Regular S1 and S2 no murmurs Abdomen: Nontender  Lab Results HIV 1 RNA Quant (copies/mL)  Date Value  12/21/2011 <20   06/12/2011 NOT DETECTED   12/20/2010 <20        CD4 T Cell Abs (cmm)  Date Value  12/21/2011 750   06/12/2011 720   12/20/2010 640      Assessment: His HIV infection remains under excellent control. I will continue Atripla.  His LDL cholesterol and triglyceride remained elevated. I encouraged him to continue to work on lifestyle modification and to continue his fish oil supplements. If there is no improvement at the time of his next visit I will consider statin therapy.  I will see if it's possible for [place] to arrange an ENT evaluation for his hearing loss.  Plan: 1. Continue Atripla and fish oil supplements 2.  Lifestyle modification 3. Try to arrange ENT evaluation 4. Followup after lab work in 6 months   Cliffton Asters, MD [facility] for Infectious Disease [facility] [phone] pager   [phone] cell 01/03/2012, 5:11 PM

## 2012-01-04 ENCOUNTER — Other Ambulatory Visit: Payer: Self-pay | Admitting: *Deleted

## 2012-01-04 DIAGNOSIS — R21 Rash and other nonspecific skin eruption: Secondary | ICD-10-CM

## 2012-01-04 MED ORDER — MUPIROCIN 2 % EX OINT: TOPICAL_OINTMENT | Freq: Three times a day (TID) | CUTANEOUS | Status: AC

## 2012-01-04 NOTE — Telephone Encounter (Signed)
States he had been told to call us with the name of the ointment he used for his rash over a year ago. It is Mupirocin 2%. I spoke with D.E. RN about this. States it should be added to med list & rx'd with 2 refills. I added it & told the pt it had been done

## 2012-01-10 ENCOUNTER — Telehealth: Payer: Self-pay | Admitting: *Deleted

## 2012-01-10 NOTE — Telephone Encounter (Signed)
Spoke to [facility] Department about possible ENT for uninsured. Given Dr. Josephina Shih [phone]) which their office visit would be $250. Pt would need to pay $125 and be set up on a payment plan for the rest of the charges. Called pt and stated the above. Pt stated he would think about it and appreciated [place] calling about this. Pt took my number down ([phone]) and said he would all me when he came to a decision. Tacey Heap RN

## 2012-03-11 ENCOUNTER — Ambulatory Visit: Payer: Self-pay

## 2012-07-08 ENCOUNTER — Other Ambulatory Visit: Payer: Self-pay

## 2012-07-11 ENCOUNTER — Other Ambulatory Visit (INDEPENDENT_AMBULATORY_CARE_PROVIDER_SITE_OTHER): Payer: Self-pay

## 2012-07-11 DIAGNOSIS — Z79899 Other long term (current) drug therapy: Secondary | ICD-10-CM

## 2012-07-11 DIAGNOSIS — B20 Human immunodeficiency virus [HIV] disease: Secondary | ICD-10-CM

## 2012-07-11 LAB — LIPID PANEL
Cholesterol: 237 mg/dL — ABNORMAL HIGH (ref 0–200)
Triglycerides: 340 mg/dL — ABNORMAL HIGH (ref <150)
VLDL: 68 mg/dL — ABNORMAL HIGH (ref 0–40)

## 2012-07-12 LAB — T-HELPER CELL (CD4) - (RCID CLINIC ONLY): CD4 % Helper T Cell: 44 % (ref 33–55)

## 2012-07-15 LAB — HIV-1 RNA QUANT-NO REFLEX-BLD: HIV 1 RNA Quant: <20 copies/mL (ref <20)

## 2012-08-01 ENCOUNTER — Ambulatory Visit: Payer: Self-pay | Admitting: Internal Medicine

## 2012-08-01 ENCOUNTER — Encounter: Payer: Self-pay | Admitting: Internal Medicine

## 2012-08-01 ENCOUNTER — Ambulatory Visit (INDEPENDENT_AMBULATORY_CARE_PROVIDER_SITE_OTHER): Payer: Self-pay | Admitting: Internal Medicine

## 2012-08-01 VITALS — BP 144/76 | HR 72 | Temp 98.1°F | Ht 68.0 in | Wt 210.0 lb

## 2012-08-01 DIAGNOSIS — B20 Human immunodeficiency virus [HIV] disease: Secondary | ICD-10-CM

## 2012-08-01 DIAGNOSIS — Z23 Encounter for immunization: Secondary | ICD-10-CM

## 2012-08-01 DIAGNOSIS — R05 Cough: Secondary | ICD-10-CM | POA: Insufficient documentation

## 2012-08-01 NOTE — Progress Notes (Signed)
Patient ID: Edwin [name], male   DOB: 05/18/1961, 52 y.o.   MRN: [id number]     [facility] for Infectious Disease  Patient Active Problem List  Diagnosis  . HIV INFECTION  . HEPATITIS B  . VENEREAL WART  . PNEUMOCYSTIS PNEUMONIA  . DYSLIPIDEMIA  . OVERWEIGHT  . DENTAL CARIES  . GYNECOMASTIA  . TOBACCO USE, QUIT  . FOLLICULITIS  . Seasonal allergies  . Hearing loss in right ear  . Cough    Patient's Medications  New Prescriptions   No medications on file  Previous Medications   DEXTROMETHORPHAN-GUAIFENESIN (MUCINEX DM) 30-600 MG PER 12 HR TABLET    Take 1 tablet by mouth every 12 (twelve) hours.   EFAVIRENZ-EMTRICTABINE-TENOFOVIR (ATRIPLA) 600-200-300 MG PER TABLET    Take 1 tablet by mouth at bedtime.   FISH OIL-OMEGA-3 FATTY ACIDS 1000 MG CAPSULE    Take 3 g by mouth daily.     LORATADINE (CLARITIN) 10 MG TABLET    Take 10 mg by mouth daily.  Modified Medications   No medications on file  Discontinued Medications   No medications on file    Subjective: [name] is in for his routine visit. He denies missing any doses of his Atripla since his past visit. He did run out of his fish oil several months ago. He has been bothered by some chronic intermittent dry cough for the last couple of months. He has not been taking his Claritin and wonders if it may be due to 2 seasonal allergies. He has also had some intermittent problems with acid indigestion but has not tried taking anything for it.  Objective: Temp: 98.1 F (36.7 C) (01/02 1611) Temp src: Oral (01/02 1611) BP: 144/76 mmHg (01/02 1611) Pulse Rate: 72  (01/02 1611)  General: He is in good spirits Skin: No rash Oral: Clear Lungs: Clear  Cor: Regular S1 and S2 with no murmurs Abdomen: Soft and nontender  Lab Results HIV 1 RNA Quant (copies/mL)  Date Value  07/11/2012 <20   12/21/2011 <20   06/12/2011 NOT DETECTED      CD4 T Cell Abs (cmm)  Date Value  07/11/2012 730   12/21/2011 750     06/12/2011 720      Lab Results  Component Value Date   CHOL 237* 07/11/2012   HDL 43 07/11/2012   LDLCALC 126* 07/11/2012   TRIG 340* 07/11/2012   CHOLHDL 5.5 07/11/2012    Assessment: His HIV infection remains under excellent control. I will continue Atripla.  His chronic cough could be due to seasonal allergies. I will have him restart Claritin for the next several weeks to see if that helps. If it doesn't help I suggested that he try over-the-counter Prilosec to see if this is cough do to acid reflux.  He continues to have dyslipidemia. His hypertriglyceridemia has worsened off of fish oil. I will have him restart that now and have encouraged him to do some Internet research on low-fat diet see if he is willing to make some lifestyle modifications. If not we may need to consider adding medical therapy.  Plan: 1. Continue Atripla 2. Restart Claritin and fish oil 3. Counseling on lifestyle modification 4. Followup after lab work in 6 months   Cliffton Asters, MD [facility] for Infectious Disease [facility] [phone] pager   [phone] cell 08/01/2012, 4:33 PM

## 2012-08-20 ENCOUNTER — Other Ambulatory Visit: Payer: Self-pay | Admitting: Internal Medicine

## 2012-09-19 ENCOUNTER — Other Ambulatory Visit: Payer: Self-pay | Admitting: *Deleted

## 2012-09-19 DIAGNOSIS — B20 Human immunodeficiency virus [HIV] disease: Secondary | ICD-10-CM

## 2012-09-19 MED ORDER — EFAVIRENZ-EMTRICITAB-TENOFOVIR 600-200-300 MG PO TABS: 1.0000 | ORAL_TABLET | Freq: Every day | ORAL | Status: DC

## 2012-10-14 ENCOUNTER — Ambulatory Visit: Payer: Self-pay

## 2012-10-21 ENCOUNTER — Ambulatory Visit: Payer: Self-pay

## 2012-10-21 ENCOUNTER — Telehealth: Payer: Self-pay | Admitting: *Deleted

## 2012-10-21 NOTE — Telephone Encounter (Signed)
Patient would like to come in Friday 10/25/12 for a tetanus booster and to address a colonoscopy referral while he is here for ADAP renewal.   [facility] Lanius Zachary George, RN

## 2012-10-21 NOTE — Telephone Encounter (Signed)
Please go ahead and give him a tetanus booster and attempt to arrange referral for a screening colonoscopy.

## 2012-10-25 ENCOUNTER — Ambulatory Visit: Payer: Self-pay

## 2012-10-25 ENCOUNTER — Ambulatory Visit (INDEPENDENT_AMBULATORY_CARE_PROVIDER_SITE_OTHER): Payer: Self-pay | Admitting: *Deleted

## 2012-10-25 DIAGNOSIS — Z23 Encounter for immunization: Secondary | ICD-10-CM

## 2012-10-25 DIAGNOSIS — Z0000 Encounter for general adult medical examination without abnormal findings: Secondary | ICD-10-CM

## 2012-10-25 NOTE — Progress Notes (Signed)
Patient asked for TDaP booster and colonoscopy referral, as he saw they were recommended on his MyChart page.  Pt received TDaP booster in LD.  Tolerated well.  Pt requested to have his colonoscopy performed at [facility].  RN will call to arrange this. Andree Coss, RN

## 2012-11-27 ENCOUNTER — Other Ambulatory Visit: Payer: Self-pay | Admitting: *Deleted

## 2012-11-27 DIAGNOSIS — Z1211 Encounter for screening for malignant neoplasm of colon: Secondary | ICD-10-CM

## 2012-12-12 ENCOUNTER — Ambulatory Visit (INDEPENDENT_AMBULATORY_CARE_PROVIDER_SITE_OTHER): Payer: Self-pay | Admitting: Gastroenterology

## 2012-12-12 ENCOUNTER — Encounter: Payer: Self-pay | Admitting: Gastroenterology

## 2012-12-12 VITALS — BP 138/79 | HR 67 | Temp 98.0°F | Ht 68.0 in | Wt 211.0 lb

## 2012-12-12 DIAGNOSIS — Z1211 Encounter for screening for malignant neoplasm of colon: Secondary | ICD-10-CM

## 2012-12-12 NOTE — Patient Instructions (Addendum)
Please complete the stool sample and return to our office. IF this is positive, we will proceed with a colonoscopy and triage you over the phone.  If it is negative, we will call you in Jan 2015 to set up a colonoscopy.

## 2012-12-16 NOTE — Progress Notes (Signed)
Patient presented today with a history of a colonoscopy in Dec 2005 noting anal canal internal hemorrhoids, otherwise normal. Due for routine screening in Dec 2015. No abdominal pain, N/V, constipation, diarrhea, rectal bleeding. He has absolutely no complaints. Due to miscommunication in scheduling, complete ifobt. If positive, triage for colonoscopy. If negative, triage for colonoscopy in Jan 2015. Patient grateful and aware of plan.

## 2013-01-20 ENCOUNTER — Other Ambulatory Visit: Payer: Self-pay

## 2013-01-20 DIAGNOSIS — B20 Human immunodeficiency virus [HIV] disease: Secondary | ICD-10-CM

## 2013-01-20 LAB — COMPREHENSIVE METABOLIC PANEL
Albumin: 4.3 g/dL (ref 3.5–5.2)
CO2: 27 mEq/L (ref 19–32)
Calcium: 9.8 mg/dL (ref 8.4–10.5)
Glucose, Bld: 106 mg/dL — ABNORMAL HIGH (ref 70–99)
Potassium: 4.8 mEq/L (ref 3.5–5.3)
Sodium: 140 mEq/L (ref 135–145)
Total Bilirubin: 0.3 mg/dL (ref 0.3–1.2)
Total Protein: 7.2 g/dL (ref 6.0–8.3)

## 2013-01-20 LAB — LIPID PANEL
Cholesterol: 244 mg/dL — ABNORMAL HIGH (ref 0–200)
Triglycerides: 291 mg/dL — ABNORMAL HIGH (ref <150)

## 2013-01-20 LAB — CBC
Hemoglobin: 14.6 g/dL (ref 13.0–17.0)
MCH: 31.1 pg (ref 26.0–34.0)
RBC: 4.7 MIL/uL (ref 4.22–5.81)

## 2013-01-21 LAB — T-HELPER CELL (CD4) - (RCID CLINIC ONLY)
CD4 % Helper T Cell: 44 % (ref 33–55)
CD4 T Cell Abs: 760 uL (ref 400–2700)

## 2013-01-21 LAB — RPR

## 2013-01-22 LAB — HIV-1 RNA QUANT-NO REFLEX-BLD: HIV 1 RNA Quant: <20 copies/mL (ref <20)

## 2013-02-04 ENCOUNTER — Encounter: Payer: Self-pay | Admitting: Internal Medicine

## 2013-02-04 ENCOUNTER — Ambulatory Visit (INDEPENDENT_AMBULATORY_CARE_PROVIDER_SITE_OTHER): Payer: Self-pay | Admitting: Internal Medicine

## 2013-02-04 VITALS — BP 153/83 | HR 68 | Temp 98.0°F | Ht 68.0 in | Wt 211.2 lb

## 2013-02-04 DIAGNOSIS — E785 Hyperlipidemia, unspecified: Secondary | ICD-10-CM

## 2013-02-04 MED ORDER — ROSUVASTATIN CALCIUM 10 MG PO TABS: 10.0000 mg | ORAL_TABLET | Freq: Every day | ORAL | Status: DC

## 2013-02-04 NOTE — Progress Notes (Signed)
Patient ID: Edwin [name], male   DOB: [birth year], 52 y.o.   MRN: [id number]          [facility] for Infectious Disease  Patient Active Problem List   Diagnosis Date Noted  . Hearing loss in right ear 01/03/2012  . Seasonal allergies 01/03/2011  . OVERWEIGHT 11/23/2008  . GYNECOMASTIA 08/29/2006  . HIV INFECTION 08/10/2006  . HEPATITIS B 08/10/2006  . VENEREAL WART 08/10/2006  . DYSLIPIDEMIA 08/10/2006  . DENTAL CARIES 08/10/2006  . TOBACCO USE, QUIT 08/10/2006  . PNEUMOCYSTIS PNEUMONIA 10/29/2004    Patient's Medications  New Prescriptions   ROSUVASTATIN (CRESTOR) 10 MG TABLET    Take 1 tablet (10 mg total) by mouth daily.  Previous Medications   EFAVIRENZ-EMTRICITABINE-TENOFOVIR (ATRIPLA) 600-200-300 MG PER TABLET    Take 1 tablet by mouth at bedtime.   FISH OIL-OMEGA-3 FATTY ACIDS 1000 MG CAPSULE    Take 3 g by mouth daily.     LORATADINE (CLARITIN) 10 MG TABLET    Take 10 mg by mouth daily.  Modified Medications   No medications on file  Discontinued Medications   DEXTROMETHORPHAN-GUAIFENESIN (MUCINEX DM) 30-600 MG PER 12 HR TABLET    Take 1 tablet by mouth every 12 (twelve) hours.    Subjective: Edwin [name] is in for his routine visit. As usual he never misses a single dose of his Atripla. He also takes his fish oil on a daily basis. He does not get any regular exercise or follow any particular diet. He has had some recent discomfort in his left heel when he first stands in the morning. It goes away after walking several steps and does not recur during the day. He has noticed that for the past 2 weeks. He denies any recent injury. He took an aspirin recently and noted that it helped.  Review of Systems: Pertinent items are noted in HPI.  Past Medical History  Diagnosis Date  . Hyperlipidemia   . HIV infection     History  Substance Use Topics  . Smoking status: Former Smoker -- 12 years    Types: Cigarettes    Quit date: 07/31/2004  . Smokeless  tobacco: Never Used  . Alcohol Use: 0.5 oz/week    1 drink(s) per week     Comment: wine    Family History  Problem Relation Age of Onset  . Hypertension Father     No Known Allergies  Objective: Temp: 98 F (36.7 C) (07/08 1115) Temp src: Oral (07/08 1115) BP: 153/83 mmHg (07/08 1115) Pulse Rate: 68 (07/08 1115)  General: He is a good spirits. His BMI is up to just over 32 Oral: No oropharyngeal lesions Skin: No rash Lungs: Clear Cor: Regular S1 and S2 no murmurs Abdomen: Obese, soft nontender Joints and extremities: Examination of his left foot is normal. There is no tenderness with palpation around his Achilles tendon, heel or plantar surface of his foot Neuro: Alert and oriented normal speech Mood and affect: Normal  Lab Results HIV 1 RNA Quant (copies/mL)  Date Value  01/20/2013 <20   07/11/2012 <20   12/21/2011 <20      CD4 T Cell Abs (cmm)  Date Value  01/20/2013 760   07/11/2012 730   12/21/2011 750    Lab Results  Component Value Date   WBC 5.2 01/20/2013   HGB 14.6 01/20/2013   HCT 41.8 01/20/2013   MCV 88.9 01/20/2013   PLT 185 01/20/2013   BMET    Component Value Date/Time  NA 140 01/20/2013 1618   K 4.8 01/20/2013 1618   CL 105 01/20/2013 1618   CO2 27 01/20/2013 1618   GLUCOSE 106* 01/20/2013 1618   BUN 11 01/20/2013 1618   CREATININE 1.12 01/20/2013 1618   CREATININE 1.08 07/01/2010 2026   CALCIUM 9.8 01/20/2013 1618   GFRNONAA >60 11/04/2008 2030   GFRAA >60 11/04/2008 2030   Lab Results  Component Value Date   ALT 22 01/20/2013   AST 20 01/20/2013   ALKPHOS 118* 01/20/2013   BILITOT 0.3 01/20/2013   Lab Results  Component Value Date   CHOL 244* 01/20/2013   HDL 44 01/20/2013   LDLCALC 142* 01/20/2013   TRIG 291* 01/20/2013   CHOLHDL 5.5 01/20/2013    Assessment: His HIV infection remains under excellent control. I will continue Atripla.  He has continued elevations of his LDL cholesterol and triglycerides. I will add Crestor 10 mg daily. Have  also talked to him at length about his elevated body mass index, mild hyperglycemia and elevated blood pressure. I encouraged him to accept a referral for nutritional counseling but he wants to try to work on lifestyle modification on his own first. I talked to him about some dietary changes and regular exercise that he should try to make.  He may have very mild plantar fasciitis causing his left heel pain. I told him he could continue to take acetaminophen or nonsteroidal anti-inflammatory agents as needed.  Plan: 1. Continue current medications 2. Start Crestor 10 mg daily 3. Try lifestyle modification 4. Followup after her complete metabolic panel and lipid panel in 3 months   Cliffton Asters, MD [facility] for Infectious Disease [facility] [phone] pager   [phone] cell 02/04/2013, 12:05 PM

## 2013-02-04 NOTE — Progress Notes (Signed)
HPI: Edwin [name] is a 52 y.o. male here for follow-up of hyperlipidemia.  Allergies: No Known Allergies  Vitals: Temp: 98 F (36.7 C) (07/08 1115) Temp src: Oral (07/08 1115) BP: 153/83 mmHg (07/08 1115) Pulse Rate: 68 (07/08 1115)  Past Medical History: No past medical history on file.  Social History: History   Social History  . Marital Status: Married    Spouse Name: N/A    Number of Children: N/A  . Years of Education: N/A   Social History Main Topics  . Smoking status: Former Smoker -- 12 years    Types: Cigarettes    Quit date: 07/31/2004  . Smokeless tobacco: Never Used  . Alcohol Use: 0.5 oz/week    1 drink(s) per week     Comment: wine  . Drug Use: No  . Sexually Active: Yes     Comment: declined condoms   Other Topics Concern  . None   Social History Narrative  . None    Current Regimen: Atripla Fish Oil  Labs: HIV 1 RNA Quant (copies/mL)  Date Value  01/20/2013 <20   07/11/2012 <20   12/21/2011 <20      CD4 T Cell Abs (cmm)  Date Value  01/20/2013 760   07/11/2012 730   12/21/2011 750      Hep B S Ab (no units)  Date Value  09/24/2006 YES      Hepatitis B Surface Ag (no units)  Date Value  09/24/2006 NO      HCV Ab (no units)  Date Value  09/24/2006 NO     CrCl: Estimated Creatinine Clearance: 85.7 ml/min (by C-G formula based on Cr of 1.12).  Lipids:    Component Value Date/Time   CHOL 244* 01/20/2013 1618   TRIG 291* 01/20/2013 1618   HDL 44 01/20/2013 1618   CHOLHDL 5.5 01/20/2013 1618   VLDL 58* 01/20/2013 1618   LDLCALC 142* 01/20/2013 1618    Assessment: Hyperlipidemia - Discussed with Dr. Orvan Falconer.  His triglycerides and LDL remain elevated despite lifestyle modifications and fish oil.  His 10 year ASCVD risk is 8.2%, and guidelines recommend statin initiation.   Recommendations: Crestor 10mg  daily  Check CMET and Lipid panel in 3 months   Edwin [name], Edwin [name], PharmD Clinical Infectious Disease  [facility] 02/04/2013, 11:55 AM

## 2013-04-03 ENCOUNTER — Ambulatory Visit: Payer: Self-pay

## 2013-04-07 ENCOUNTER — Ambulatory Visit: Payer: Self-pay

## 2013-05-06 ENCOUNTER — Other Ambulatory Visit (INDEPENDENT_AMBULATORY_CARE_PROVIDER_SITE_OTHER): Payer: Self-pay

## 2013-05-06 DIAGNOSIS — E785 Hyperlipidemia, unspecified: Secondary | ICD-10-CM

## 2013-05-07 LAB — COMPLETE METABOLIC PANEL WITHOUT GFR
ALT: 29 U/L (ref 0–53)
AST: 27 U/L (ref 0–37)
Albumin: 4.2 g/dL (ref 3.5–5.2)
Alkaline Phosphatase: 98 U/L (ref 39–117)
BUN: 12 mg/dL (ref 6–23)
CO2: 28 meq/L (ref 19–32)
Calcium: 9.2 mg/dL (ref 8.4–10.5)
Chloride: 105 meq/L (ref 96–112)
Creat: 1.04 mg/dL (ref 0.50–1.35)
GFR, Est African American: >89 mL/min
GFR, Est Non African American: 82 mL/min
Glucose, Bld: 95 mg/dL (ref 70–99)
Potassium: 4.6 meq/L (ref 3.5–5.3)
Sodium: 141 meq/L (ref 135–145)
Total Bilirubin: 0.2 mg/dL — ABNORMAL LOW (ref 0.3–1.2)
Total Protein: 6.5 g/dL (ref 6.0–8.3)

## 2013-05-07 LAB — LIPID PANEL
Cholesterol: 178 mg/dL (ref 0–200)
HDL: 39 mg/dL — ABNORMAL LOW
LDL Cholesterol: 63 mg/dL (ref 0–99)
Total CHOL/HDL Ratio: 4.6 ratio
Triglycerides: 382 mg/dL — ABNORMAL HIGH
VLDL: 76 mg/dL — ABNORMAL HIGH (ref 0–40)

## 2013-05-20 ENCOUNTER — Encounter: Payer: Self-pay | Admitting: Internal Medicine

## 2013-05-20 ENCOUNTER — Ambulatory Visit (INDEPENDENT_AMBULATORY_CARE_PROVIDER_SITE_OTHER): Payer: Self-pay | Admitting: Internal Medicine

## 2013-05-20 VITALS — BP 145/82 | HR 80 | Temp 98.3°F | Ht 68.0 in | Wt 213.0 lb

## 2013-05-20 DIAGNOSIS — B20 Human immunodeficiency virus [HIV] disease: Secondary | ICD-10-CM

## 2013-05-20 DIAGNOSIS — H00019 Hordeolum externum unspecified eye, unspecified eyelid: Secondary | ICD-10-CM

## 2013-05-20 DIAGNOSIS — H00013 Hordeolum externum right eye, unspecified eyelid: Secondary | ICD-10-CM

## 2013-05-20 DIAGNOSIS — Z23 Encounter for immunization: Secondary | ICD-10-CM

## 2013-05-20 NOTE — Progress Notes (Signed)
Patient ID: Edwin [name], male   DOB: Mar 26, 1961, 52 y.o.   MRN: [id number]          [facility] for Infectious Disease  Patient Active Problem List   Diagnosis Date Noted  . Hordeolum 05/20/2013  . Hearing loss in right ear 01/03/2012  . Seasonal allergies 01/03/2011  . OVERWEIGHT 11/23/2008  . GYNECOMASTIA 08/29/2006  . HIV INFECTION 08/10/2006  . HEPATITIS B 08/10/2006  . VENEREAL WART 08/10/2006  . DYSLIPIDEMIA 08/10/2006  . DENTAL CARIES 08/10/2006  . TOBACCO USE, QUIT 08/10/2006  . PNEUMOCYSTIS PNEUMONIA 10/29/2004    Patient's Medications  New Prescriptions   No medications on file  Previous Medications   EFAVIRENZ-EMTRICITABINE-TENOFOVIR (ATRIPLA) 600-200-300 MG PER TABLET    Take 1 tablet by mouth at bedtime.   FISH OIL-OMEGA-3 FATTY ACIDS 1000 MG CAPSULE    Take 3 g by mouth daily.     LORATADINE (CLARITIN) 10 MG TABLET    Take 10 mg by mouth daily.   ROSUVASTATIN (CRESTOR) 10 MG TABLET    Take 1 tablet (10 mg total) by mouth daily.  Modified Medications   No medications on file  Discontinued Medications   No medications on file    Subjective: Edwin [name] his in for his routine visit. He has not missed any doses of his Atripla. He is tolerating his Crestor well. He ran out of his fish oil several weeks ago and has not gotten a new supply.  A few months ago he developed a tender nodule on his right upper eyelid. It decreased in size and became less painful but has not continued to resolve.  Review of Systems: Pertinent items are noted in HPI.  Past Medical History  Diagnosis Date  . Hyperlipidemia   . HIV infection     History  Substance Use Topics  . Smoking status: Former Smoker -- 12 years    Types: Cigarettes    Quit date: 07/31/2004  . Smokeless tobacco: Never Used  . Alcohol Use: 0.5 oz/week    1 drink(s) per week     Comment: wine    Family History  Problem Relation Age of Onset  . Hypertension Father     No Known  Allergies  Objective: Temp: 98.3 F (36.8 C) (10/21 1610) Temp src: Oral (10/21 1610) BP: 145/82 mmHg (10/21 1610) Pulse Rate: 80 (10/21 1610)  General: He is in good spirits as usual Eyes: He has a nontender 10 mm nodule on the right upper eyelid laterally Oral: No oropharyngeal lesions Skin: No rash Lungs: Clear Cor: Regular S1-S2 no murmurs  Lab Results HIV 1 RNA Quant (copies/mL)  Date Value  01/20/2013 <20   07/11/2012 <20   12/21/2011 <20      CD4 T Cell Abs (cmm)  Date Value  01/20/2013 760   07/11/2012 730   12/21/2011 750      Assessment: His HIV infection remains under excellent control. I will continue Atripla.  His LDL cholesterol is now at goal on Crestor.  His triglycerides have gone up since stopping fish oil.  He probably has a slowly resolving stye.  Plan: 1. Continue Atripla and Crestor 2. Restart fish oil 3. Influenza vaccination today 4. Follow up after lab work in 3 months   Cliffton Asters, MD [facility] for Infectious Disease [facility] [phone] pager   [phone] cell 05/20/2013, 4:44 PM

## 2013-05-23 ENCOUNTER — Encounter: Payer: Self-pay | Admitting: Internal Medicine

## 2013-05-26 NOTE — Telephone Encounter (Signed)
Yes

## 2013-07-02 ENCOUNTER — Telehealth: Payer: Self-pay | Admitting: *Deleted

## 2013-07-02 NOTE — Telephone Encounter (Signed)
Yes he can substitute the fish oil.

## 2013-07-02 NOTE — Telephone Encounter (Signed)
Patient wanted to let Dr. Orvan Falconer know that he has stopped the Crestor, he said it was causing his arms to hurt and a dry cough. Asked if he wanted something else prescribed and he said no. He also wanted to know if he could take fish flax borage oil with omega 3 6 9, instead of just fish oil pills. Please advise Wendall Mola

## 2013-07-17 ENCOUNTER — Telehealth: Payer: Self-pay | Admitting: Internal Medicine

## 2013-07-17 NOTE — Telephone Encounter (Signed)
Jan recall list has patient to be triaged in Jan 2015 TCS

## 2013-07-30 NOTE — Telephone Encounter (Signed)
LMOM for pt to return call. ( iFOBT at front for pick up).

## 2013-07-30 NOTE — Telephone Encounter (Signed)
Pt returned call and is aware. He will come by next week to pick up iFOBT and I will explain the instructions when he comes in.

## 2013-07-30 NOTE — Telephone Encounter (Signed)
Per Gerrit Halls. NP pt should do an iFOBT and if it is negative, he will be due for his colonoscopy in 06/2014.  If it is positive, we will proceed with triaging and scheduling colonoscopy now.

## 2013-08-08 NOTE — Telephone Encounter (Signed)
Left reminder message on the phone for pt to pick up iFOBT. ( Also, note that we close at 12:00 noon today).

## 2013-08-14 ENCOUNTER — Other Ambulatory Visit: Payer: Self-pay

## 2013-08-15 ENCOUNTER — Other Ambulatory Visit (INDEPENDENT_AMBULATORY_CARE_PROVIDER_SITE_OTHER): Payer: Self-pay

## 2013-08-15 DIAGNOSIS — Z79899 Other long term (current) drug therapy: Secondary | ICD-10-CM

## 2013-08-15 DIAGNOSIS — Z113 Encounter for screening for infections with a predominantly sexual mode of transmission: Secondary | ICD-10-CM

## 2013-08-15 DIAGNOSIS — B20 Human immunodeficiency virus [HIV] disease: Secondary | ICD-10-CM

## 2013-08-15 LAB — CBC
HCT: 43.7 % (ref 39.0–52.0)
HEMOGLOBIN: 15.2 g/dL (ref 13.0–17.0)
MCH: 31.9 pg (ref 26.0–34.0)
MCHC: 34.8 g/dL (ref 30.0–36.0)
MCV: 91.8 fL (ref 78.0–100.0)
Platelets: 190 10*3/uL (ref 150–400)
RBC: 4.76 MIL/uL (ref 4.22–5.81)
RDW: 14.2 % (ref 11.5–15.5)
WBC: 4.8 10*3/uL (ref 4.0–10.5)

## 2013-08-15 LAB — COMPLETE METABOLIC PANEL WITH GFR
ALK PHOS: 139 U/L — AB (ref 39–117)
ALT: 26 U/L (ref 0–53)
AST: 24 U/L (ref 0–37)
Albumin: 4.2 g/dL (ref 3.5–5.2)
BILIRUBIN TOTAL: 0.3 mg/dL (ref 0.3–1.2)
BUN: 16 mg/dL (ref 6–23)
CO2: 26 mEq/L (ref 19–32)
Calcium: 9.5 mg/dL (ref 8.4–10.5)
Chloride: 104 mEq/L (ref 96–112)
Creat: 1.13 mg/dL (ref 0.50–1.35)
GFR, Est African American: 85 mL/min
GFR, Est Non African American: 73 mL/min
Glucose, Bld: 106 mg/dL — ABNORMAL HIGH (ref 70–99)
Potassium: 4.5 mEq/L (ref 3.5–5.3)
Sodium: 138 mEq/L (ref 135–145)
Total Protein: 6.8 g/dL (ref 6.0–8.3)

## 2013-08-15 LAB — LIPID PANEL
CHOL/HDL RATIO: 5.9 ratio
CHOLESTEROL: 240 mg/dL — AB (ref 0–200)
HDL: 41 mg/dL (ref >39)
LDL Cholesterol: 136 mg/dL — ABNORMAL HIGH (ref 0–99)
TRIGLYCERIDES: 314 mg/dL — AB (ref <150)
VLDL: 63 mg/dL — ABNORMAL HIGH (ref 0–40)

## 2013-08-15 LAB — RPR

## 2013-08-18 LAB — HIV-1 RNA QUANT-NO REFLEX-BLD
HIV 1 RNA Quant: <20 {copies}/mL
HIV-1 RNA Quant, Log: <1.3 {Log}

## 2013-08-18 LAB — T-HELPER CELL (CD4) - (RCID CLINIC ONLY)
CD4 % Helper T Cell: 42 % (ref 33–55)
CD4 T Cell Abs: 580 /uL (ref 400–2700)

## 2013-08-19 ENCOUNTER — Other Ambulatory Visit: Payer: Self-pay

## 2013-08-26 NOTE — Telephone Encounter (Signed)
Reminder letter mailed to pt to come by and pick up iFOBT.

## 2013-08-28 ENCOUNTER — Ambulatory Visit (INDEPENDENT_AMBULATORY_CARE_PROVIDER_SITE_OTHER): Payer: Self-pay | Admitting: Internal Medicine

## 2013-08-28 ENCOUNTER — Encounter: Payer: Self-pay | Admitting: Internal Medicine

## 2013-08-28 ENCOUNTER — Ambulatory Visit: Payer: Self-pay

## 2013-08-28 VITALS — BP 132/78 | HR 73 | Temp 98.2°F | Wt 205.0 lb

## 2013-08-28 DIAGNOSIS — B20 Human immunodeficiency virus [HIV] disease: Secondary | ICD-10-CM

## 2013-08-28 MED ORDER — EFAVIRENZ-EMTRICITAB-TENOFOVIR 600-200-300 MG PO TABS: 1.0000 | ORAL_TABLET | Freq: Every day | ORAL | Status: DC

## 2013-08-28 NOTE — Progress Notes (Signed)
Patient ID: Edwin [name], male   DOB: Mar 16, 1961, 53 y.o.   MRN: [id number]          [facility]  Patient Active Problem List   Diagnosis Date Noted  . Hordeolum 05/20/2013  . Hearing loss in right ear 01/03/2012  . Seasonal allergies 01/03/2011  . OVERWEIGHT 11/23/2008  . GYNECOMASTIA 08/29/2006  . HIV INFECTION 08/10/2006  . HEPATITIS B 08/10/2006  . VENEREAL WART 08/10/2006  . DYSLIPIDEMIA 08/10/2006  . DENTAL CARIES 08/10/2006  . TOBACCO USE, QUIT 08/10/2006  . PNEUMOCYSTIS PNEUMONIA 10/29/2004    Patient's Medications  New Prescriptions   No medications on file  Previous Medications   FISH OIL-OMEGA-3 FATTY ACIDS 1000 MG CAPSULE    Take 3 g by mouth daily.     LORATADINE (CLARITIN) 10 MG TABLET    Take 10 mg by mouth daily.  Modified Medications   Modified Medication Previous Medication   EFAVIRENZ-EMTRICITABINE-TENOFOVIR (ATRIPLA) 600-200-300 MG PER TABLET efavirenz-emtricitabine-tenofovir (ATRIPLA) 600-200-300 MG per tablet      Take 1 tablet by mouth at bedtime.    Take 1 tablet by mouth at bedtime.  Discontinued Medications   ROSUVASTATIN (CRESTOR) 10 MG TABLET    Take 1 tablet (10 mg total) by mouth daily.    Subjective: Edwin [name] is in for his routine visit. He denies missing any doses of his Atripla since his last visit. He developed some aching pain in his forearms last month and stopped taking his Crestor. He aching pain resolved. The nodule on his right upper eyelid is slowly decreasing in size. It is not causing any discomfort or visual abnormalities. Review of Systems: Pertinent items are noted in HPI.  Past Medical History  Diagnosis Date  . Hyperlipidemia   . HIV infection     History  Substance Use Topics  . Smoking status: Former Smoker -- 12 years    Types: Cigarettes    Quit date: 07/31/2004  . Smokeless tobacco: Never Used  . Alcohol Use: 0.5 oz/week    1 drink(s) per week     Comment: wine    Family  History  Problem Relation Age of Onset  . Hypertension Father     No Known Allergies  Objective: Temp: 98.2 F (36.8 C) (01/29 1621) Temp src: Oral (01/29 1621) BP: 132/78 mmHg (01/29 1621) Pulse Rate: 73 (01/29 1621)  Body mass index is 31.18 kg/(m^2).  General: He is in no distress Oral: No oropharyngeal lesions Eyes: Small hordeolum on the outer edge of right upper eyelid has decreased in size Skin: No rash Lungs: Clear Cor: Regular S1 and S2 no murmurs Abdomen: Soft and nontender Joints and extremities: Normal with no tenderness on palpation of his muscles Neuro: Alert with normal speech and conversation Mood and affect: Bright and normal  Lab Results Lab Results  Component Value Date   WBC 4.8 08/15/2013   HGB 15.2 08/15/2013   HCT 43.7 08/15/2013   MCV 91.8 08/15/2013   PLT 190 08/15/2013    Lab Results  Component Value Date   CREATININE 1.13 08/15/2013   BUN 16 08/15/2013   NA 138 08/15/2013   K 4.5 08/15/2013   CL 104 08/15/2013   CO2 26 08/15/2013    Lab Results  Component Value Date   ALT 26 08/15/2013   AST 24 08/15/2013   ALKPHOS 139* 08/15/2013   BILITOT 0.3 08/15/2013    Lab Results  Component Value Date   CHOL 240* 08/15/2013   HDL  41 08/15/2013   LDLCALC 136* 08/15/2013   TRIG 314* 08/15/2013   CHOLHDL 5.9 08/15/2013    Lab Results HIV 1 RNA Quant (copies/mL)  Date Value  08/15/2013 <20   01/20/2013 <20   07/11/2012 <20      CD4 T Cell Abs (/uL)  Date Value  08/15/2013 580   01/20/2013 760   07/11/2012 730      Assessment: His HIV infection remains under excellent control. I will continue Atripla.  He may have had some muscle inflammation while on Crestor. Not surprisingly his LDL cholesterol has risen again since stopping Crestor. He is unwilling to try a lower dose at this time. He will continue fish oil supplements.  A small hordeolum is resolving slowly.  Plan: 1. Continue Atripla 2. Continue fish oil 3. Followup after lab work in 6  months   Cliffton Asters, MD [facility] for Infectious Disease [facility] [phone] pager   [phone] cell 08/28/2013, 5:18 PM

## 2013-09-05 ENCOUNTER — Other Ambulatory Visit: Payer: Self-pay | Admitting: *Deleted

## 2013-09-05 DIAGNOSIS — B20 Human immunodeficiency virus [HIV] disease: Secondary | ICD-10-CM

## 2013-09-05 MED ORDER — EFAVIRENZ-EMTRICITAB-TENOFOVIR 600-200-300 MG PO TABS: 1.0000 | ORAL_TABLET | Freq: Every day | ORAL | Status: DC

## 2014-01-13 ENCOUNTER — Other Ambulatory Visit (INDEPENDENT_AMBULATORY_CARE_PROVIDER_SITE_OTHER): Payer: Self-pay

## 2014-01-13 DIAGNOSIS — B20 Human immunodeficiency virus [HIV] disease: Secondary | ICD-10-CM

## 2014-01-13 DIAGNOSIS — Z79899 Other long term (current) drug therapy: Secondary | ICD-10-CM

## 2014-01-13 LAB — LIPID PANEL
CHOL/HDL RATIO: 6.3 ratio
CHOLESTEROL: 239 mg/dL — AB (ref 0–200)
HDL: 38 mg/dL — AB (ref >39)
Triglycerides: 481 mg/dL — ABNORMAL HIGH (ref <150)

## 2014-01-13 LAB — COMPREHENSIVE METABOLIC PANEL
ALT: 22 U/L (ref 0–53)
AST: 19 U/L (ref 0–37)
Albumin: 4.2 g/dL (ref 3.5–5.2)
Alkaline Phosphatase: 124 U/L — ABNORMAL HIGH (ref 39–117)
BUN: 15 mg/dL (ref 6–23)
CHLORIDE: 104 meq/L (ref 96–112)
CO2: 27 meq/L (ref 19–32)
CREATININE: 1.1 mg/dL (ref 0.50–1.35)
Calcium: 9.7 mg/dL (ref 8.4–10.5)
Glucose, Bld: 91 mg/dL (ref 70–99)
Potassium: 4.4 mEq/L (ref 3.5–5.3)
Sodium: 139 mEq/L (ref 135–145)
Total Bilirubin: 0.3 mg/dL (ref 0.2–1.2)
Total Protein: 6.9 g/dL (ref 6.0–8.3)

## 2014-01-14 LAB — HIV-1 RNA QUANT-NO REFLEX-BLD
HIV 1 RNA Quant: <20 copies/mL (ref <20)
HIV-1 RNA Quant, Log: <1.3 {Log} (ref <1.30)

## 2014-01-15 LAB — T-HELPER CELL (CD4) - (RCID CLINIC ONLY)
CD4 % Helper T Cell: 45 % (ref 33–55)
CD4 T Cell Abs: 790 /uL (ref 400–2700)

## 2014-01-27 ENCOUNTER — Ambulatory Visit (INDEPENDENT_AMBULATORY_CARE_PROVIDER_SITE_OTHER): Payer: Self-pay | Admitting: Internal Medicine

## 2014-01-27 ENCOUNTER — Ambulatory Visit: Payer: Self-pay

## 2014-01-27 ENCOUNTER — Encounter: Payer: Self-pay | Admitting: Internal Medicine

## 2014-01-27 VITALS — BP 145/78 | HR 83 | Temp 98.1°F | Wt 212.5 lb

## 2014-01-27 DIAGNOSIS — B20 Human immunodeficiency virus [HIV] disease: Secondary | ICD-10-CM

## 2014-01-27 MED ORDER — ELVITEG-COBIC-EMTRICIT-TENOFDF 150-150-200-300 MG PO TABS: 1.0000 | ORAL_TABLET | Freq: Every day | ORAL | Status: DC

## 2014-01-27 NOTE — Progress Notes (Signed)
Patient ID: Edwin [name], male   DOB: Dec 13, 1960, 53 y.o.   MRN: [id number]          Patient Active Problem List   Diagnosis Date Noted  . Hordeolum 05/20/2013  . Hearing loss in right ear 01/03/2012  . Seasonal allergies 01/03/2011  . OVERWEIGHT 11/23/2008  . GYNECOMASTIA 08/29/2006  . HIV INFECTION 08/10/2006  . HEPATITIS B 08/10/2006  . VENEREAL WART 08/10/2006  . DYSLIPIDEMIA 08/10/2006  . DENTAL CARIES 08/10/2006  . TOBACCO USE, QUIT 08/10/2006  . PNEUMOCYSTIS PNEUMONIA 10/29/2004    Patient's Medications  New Prescriptions   ELVITEGRAVIR-COBICISTAT-EMTRICITABINE-TENOFOVIR (STRIBILD) 150-150-200-300 MG TABS TABLET    Take 1 tablet by mouth daily with breakfast.  Previous Medications   FISH OIL-OMEGA-3 FATTY ACIDS 1000 MG CAPSULE    Take 3 g by mouth daily.     LORATADINE (CLARITIN) 10 MG TABLET    Take 10 mg by mouth daily.  Modified Medications   No medications on file  Discontinued Medications   EFAVIRENZ-EMTRICITABINE-TENOFOVIR (ATRIPLA) 600-200-300 MG PER TABLET    Take 1 tablet by mouth at bedtime.    Subjective: Edwin [name] is in for his routine visit. He has been under more financial stress recently and has not been sleeping well. He also has been having more problems with bad dreams. He has not missed any doses of his Atripla. Review of Systems: Pertinent items are noted in HPI.  Past Medical History  Diagnosis Date  . Hyperlipidemia   . HIV infection     History  Substance Use Topics  . Smoking status: Former Smoker -- 12 years    Types: Cigarettes    Quit date: 07/31/2004  . Smokeless tobacco: Never Used  . Alcohol Use: 0.5 oz/week    1 drink(s) per week     Comment: wine    Family History  Problem Relation Age of Onset  . Hypertension Father     No Known Allergies  Objective: Temp: 98.1 F (36.7 C) (06/30 1529) Temp src: Oral (06/30 1529) BP: 145/78 mmHg (06/30 1529) Pulse Rate: 83 (06/30 1529) Body mass index is 32.32  kg/(m^2).  General: He is in good spirits Oral: No oropharyngeal lesions Skin: No rash Lungs: Clear Cor: Regular S1 and S2 no murmurs   Lab Results Lab Results  Component Value Date   WBC 4.8 08/15/2013   HGB 15.2 08/15/2013   HCT 43.7 08/15/2013   MCV 91.8 08/15/2013   PLT 190 08/15/2013    Lab Results  Component Value Date   CREATININE 1.10 01/13/2014   BUN 15 01/13/2014   NA 139 01/13/2014   K 4.4 01/13/2014   CL 104 01/13/2014   CO2 27 01/13/2014    Lab Results  Component Value Date   ALT 22 01/13/2014   AST 19 01/13/2014   ALKPHOS 124* 01/13/2014   BILITOT 0.3 01/13/2014    Lab Results  Component Value Date   CHOL 239* 01/13/2014   HDL 38* 01/13/2014   LDLCALC NOT CALC 01/13/2014   TRIG 481* 01/13/2014   CHOLHDL 6.3 01/13/2014    Lab Results HIV 1 RNA Quant (copies/mL)  Date Value  01/13/2014 <20   08/15/2013 <20   01/20/2013 <20      CD4 T Cell Abs (/uL)  Date Value  01/13/2014 790   08/15/2013 580   01/20/2013 760      Assessment: His infection remains under excellent control. I suspect that his sleep disturbance is multifactorial as bad dreams shortly may be a  side effect of Atripla. I will switch him to Stribild to see if they improve.  Plan: 1. Change Atripla to Stribild 2. I asked him to call me in 2 weeks if he is not feeling better 3. Follow up after lab work in 6 months   Cliffton AstersJohn Campbell, MD [facility] [phone] pager   [phone] cell 01/27/2014, 3:43 PM

## 2014-02-02 ENCOUNTER — Other Ambulatory Visit: Payer: Self-pay | Admitting: Licensed Clinical Social Worker

## 2014-02-02 DIAGNOSIS — B20 Human immunodeficiency virus [HIV] disease: Secondary | ICD-10-CM

## 2014-02-02 MED ORDER — ELVITEG-COBIC-EMTRICIT-TENOFDF 150-150-200-300 MG PO TABS: 1.0000 | ORAL_TABLET | Freq: Every day | ORAL | Status: DC

## 2014-07-15 ENCOUNTER — Other Ambulatory Visit (INDEPENDENT_AMBULATORY_CARE_PROVIDER_SITE_OTHER): Payer: Self-pay

## 2014-07-15 DIAGNOSIS — Z113 Encounter for screening for infections with a predominantly sexual mode of transmission: Secondary | ICD-10-CM

## 2014-07-15 DIAGNOSIS — Z79899 Other long term (current) drug therapy: Secondary | ICD-10-CM

## 2014-07-15 DIAGNOSIS — B20 Human immunodeficiency virus [HIV] disease: Secondary | ICD-10-CM

## 2014-07-15 LAB — COMPREHENSIVE METABOLIC PANEL
ALBUMIN: 4.3 g/dL (ref 3.5–5.2)
ALT: 18 U/L (ref 0–53)
AST: 17 U/L (ref 0–37)
Alkaline Phosphatase: 118 U/L — ABNORMAL HIGH (ref 39–117)
BILIRUBIN TOTAL: 0.3 mg/dL (ref 0.2–1.2)
BUN: 11 mg/dL (ref 6–23)
CO2: 27 mEq/L (ref 19–32)
Calcium: 9.3 mg/dL (ref 8.4–10.5)
Chloride: 105 mEq/L (ref 96–112)
Creat: 1.24 mg/dL (ref 0.50–1.35)
Glucose, Bld: 111 mg/dL — ABNORMAL HIGH (ref 70–99)
POTASSIUM: 4.4 meq/L (ref 3.5–5.3)
Sodium: 140 mEq/L (ref 135–145)
Total Protein: 6.8 g/dL (ref 6.0–8.3)

## 2014-07-15 LAB — LIPID PANEL
Cholesterol: 224 mg/dL — ABNORMAL HIGH (ref 0–200)
HDL: 38 mg/dL — AB (ref >39)
Total CHOL/HDL Ratio: 5.9 Ratio
Triglycerides: 405 mg/dL — ABNORMAL HIGH (ref <150)

## 2014-07-15 LAB — CBC
HEMATOCRIT: 44.1 % (ref 39.0–52.0)
HEMOGLOBIN: 15 g/dL (ref 13.0–17.0)
MCH: 31.6 pg (ref 26.0–34.0)
MCHC: 34 g/dL (ref 30.0–36.0)
MCV: 93 fL (ref 78.0–100.0)
MPV: 9.9 fL (ref 9.4–12.4)
Platelets: 178 10*3/uL (ref 150–400)
RBC: 4.74 MIL/uL (ref 4.22–5.81)
RDW: 13.8 % (ref 11.5–15.5)
WBC: 5.1 10*3/uL (ref 4.0–10.5)

## 2014-07-16 LAB — HIV-1 RNA QUANT-NO REFLEX-BLD: HIV-1 RNA Quant, Log: <1.3 {Log} (ref <1.30)

## 2014-07-16 LAB — T-HELPER CELL (CD4) - (RCID CLINIC ONLY)
CD4 T CELL HELPER: 39 % (ref 33–55)
CD4 T Cell Abs: 600 /uL (ref 400–2700)

## 2014-07-16 LAB — RPR

## 2014-08-03 DIAGNOSIS — R739 Hyperglycemia, unspecified: Secondary | ICD-10-CM | POA: Insufficient documentation

## 2014-08-04 ENCOUNTER — Ambulatory Visit (INDEPENDENT_AMBULATORY_CARE_PROVIDER_SITE_OTHER): Payer: Self-pay | Admitting: *Deleted

## 2014-08-04 ENCOUNTER — Ambulatory Visit: Payer: Self-pay

## 2014-08-04 ENCOUNTER — Encounter: Payer: Self-pay | Admitting: Internal Medicine

## 2014-08-04 ENCOUNTER — Ambulatory Visit (INDEPENDENT_AMBULATORY_CARE_PROVIDER_SITE_OTHER): Payer: Self-pay | Admitting: Internal Medicine

## 2014-08-04 VITALS — BP 137/81 | HR 67 | Temp 97.7°F | Wt 213.0 lb

## 2014-08-04 DIAGNOSIS — R739 Hyperglycemia, unspecified: Secondary | ICD-10-CM

## 2014-08-04 DIAGNOSIS — B20 Human immunodeficiency virus [HIV] disease: Secondary | ICD-10-CM

## 2014-08-04 DIAGNOSIS — Z23 Encounter for immunization: Secondary | ICD-10-CM

## 2014-08-04 MED ORDER — ELVITEG-COBIC-EMTRICIT-TENOFAF 150-150-200-10 MG PO TABS: 1.0000 | ORAL_TABLET | Freq: Every day | ORAL | Status: DC

## 2014-08-04 NOTE — Progress Notes (Signed)
Patient ID: Edwin [name], male   DOB: [birth year], 54 y.o.   MRN: [id number]          Patient Active Problem List   Diagnosis Date Noted  . Human immunodeficiency virus (HIV) disease 08/10/2006    Priority: High  . Hyperglycemia 08/03/2014    Priority: Medium  . Obesity 11/23/2008    Priority: Medium  . DYSLIPIDEMIA 08/10/2006    Priority: Medium  . Hordeolum 05/20/2013  . Hearing loss in right ear 01/03/2012  . Seasonal allergies 01/03/2011  . GYNECOMASTIA 08/29/2006  . HEPATITIS B 08/10/2006  . VENEREAL WART 08/10/2006  . DENTAL CARIES 08/10/2006  . TOBACCO USE, QUIT 08/10/2006  . PNEUMOCYSTIS PNEUMONIA 10/29/2004    Patient's Medications  New Prescriptions   ELVITEGRAVIR-COBICISTAT-EMTRICITABINE-TENOFOVIR (GENVOYA) 150-150-200-10 MG TABS TABLET    Take 1 tablet by mouth daily with breakfast.  Previous Medications   FISH OIL-OMEGA-3 FATTY ACIDS 1000 MG CAPSULE    Take 3 g by mouth daily.     LORATADINE (CLARITIN) 10 MG TABLET    Take 10 mg by mouth daily.  Modified Medications   No medications on file  Discontinued Medications   ELVITEGRAVIR-COBICISTAT-EMTRICITABINE-TENOFOVIR (STRIBILD) 150-150-200-300 MG TABS TABLET    Take 1 tablet by mouth daily with breakfast.    Subjective:  Court is in for his routine visit. He describes taking his  Stribild correctly. He has not missed any doses. 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 He does not get any regular exercise. Review of Systems: Constitutional: negative Eyes: negative Ears, nose, mouth, throat, and face: negative Respiratory: negative Cardiovascular: negative Gastrointestinal: negative Genitourinary:negative  Past Medical History  Diagnosis Date  . Hyperlipidemia   . HIV infection     History  Substance Use Topics  . Smoking status: Former Smoker -- 12 years    Types: Cigarettes    Quit date: 07/31/2004  . Smokeless tobacco: Never Used  . Alcohol Use: 0.5 oz/week    1 drink(s) per  week     Comment: wine    Family History  Problem Relation Age of Onset  . Hypertension Father     No Known Allergies  Objective: Temp: 97.7 F (36.5 C) (01/05 0941) Temp Source: Oral (01/05 0941) BP: 137/81 mmHg (01/05 0941) Pulse Rate: 67 (01/05 0941) Body mass index is 32.39 kg/(m^2).  General:  He is in good spirits. His weight is 213 pounds Oral:  No oropharyngeal lesions Skin:  Mild dry skin but no rash Lungs:  clear Cor:  Regular S1 and S2 with no murmurs Abdomen:  Obese, soft and nontender  Lab Results Lab Results  Component Value Date   WBC 5.1 07/15/2014   HGB 15.0 07/15/2014   HCT 44.1 07/15/2014   MCV 93.0 07/15/2014   PLT 178 07/15/2014    Lab Results  Component Value Date   CREATININE 1.24 07/15/2014   BUN 11 07/15/2014   NA 140 07/15/2014   K 4.4 07/15/2014   CL 105 07/15/2014   CO2 27 07/15/2014    Lab Results  Component Value Date   ALT 18 07/15/2014   AST 17 07/15/2014   ALKPHOS 118* 07/15/2014   BILITOT 0.3 07/15/2014    Lab Results  Component Value Date   CHOL 224* 07/15/2014   HDL 38* 07/15/2014   LDLCALC NOT CALC 07/15/2014   TRIG 405* 07/15/2014   CHOLHDL 5.9 07/15/2014    Lab Results HIV 1 RNA QUANT (copies/mL)  Date Value  07/15/2014 <  20  01/13/2014 <20  08/15/2013 <20   CD4 T CELL ABS (/uL)  Date Value  07/15/2014 600  01/13/2014 790  08/15/2013 580     Assessment: His HIV infection remains under excellent control. I will change his Stribild to the new preparation called Genvoya.  His primary issues at this point are obesity, hyperglycemia and dyslipidemia. He has not seen a primary care physician in many years. I talked to him at length about lifestyle modification.  Plan: 1.  change Stribild to Genvoya 2.  Lifestyle modification counseling provided 3.  Follow-up after blood work in 6 months   Cliffton Asters, MD [facility] for Infectious Disease [facility] [phone] pager   [phone]  cell 08/04/2014, 9:56 AM

## 2014-08-11 ENCOUNTER — Other Ambulatory Visit: Payer: Self-pay | Admitting: Licensed Clinical Social Worker

## 2014-08-11 DIAGNOSIS — B20 Human immunodeficiency virus [HIV] disease: Secondary | ICD-10-CM

## 2014-08-11 MED ORDER — ELVITEG-COBIC-EMTRICIT-TENOFAF 150-150-200-10 MG PO TABS: 1.0000 | ORAL_TABLET | Freq: Every day | ORAL | Status: DC

## 2015-02-02 ENCOUNTER — Other Ambulatory Visit (INDEPENDENT_AMBULATORY_CARE_PROVIDER_SITE_OTHER): Payer: Self-pay

## 2015-02-02 DIAGNOSIS — R739 Hyperglycemia, unspecified: Secondary | ICD-10-CM

## 2015-02-02 DIAGNOSIS — Z113 Encounter for screening for infections with a predominantly sexual mode of transmission: Secondary | ICD-10-CM

## 2015-02-02 DIAGNOSIS — B20 Human immunodeficiency virus [HIV] disease: Secondary | ICD-10-CM

## 2015-02-02 DIAGNOSIS — Z79899 Other long term (current) drug therapy: Secondary | ICD-10-CM

## 2015-02-02 LAB — CBC
HCT: 42.2 % (ref 39.0–52.0)
Hemoglobin: 14.7 g/dL (ref 13.0–17.0)
MCH: 31.7 pg (ref 26.0–34.0)
MCHC: 34.8 g/dL (ref 30.0–36.0)
MCV: 91.1 fL (ref 78.0–100.0)
MPV: 10.1 fL (ref 8.6–12.4)
PLATELETS: 180 10*3/uL (ref 150–400)
RBC: 4.63 MIL/uL (ref 4.22–5.81)
RDW: 15 % (ref 11.5–15.5)
WBC: 4.9 10*3/uL (ref 4.0–10.5)

## 2015-02-02 LAB — COMPREHENSIVE METABOLIC PANEL
ALT: 17 U/L (ref 0–53)
AST: 14 U/L (ref 0–37)
Albumin: 4.4 g/dL (ref 3.5–5.2)
Alkaline Phosphatase: 81 U/L (ref 39–117)
BILIRUBIN TOTAL: 0.4 mg/dL (ref 0.2–1.2)
BUN: 11 mg/dL (ref 6–23)
CHLORIDE: 104 meq/L (ref 96–112)
CO2: 31 meq/L (ref 19–32)
Calcium: 10.1 mg/dL (ref 8.4–10.5)
Creat: 1.19 mg/dL (ref 0.50–1.35)
Glucose, Bld: 100 mg/dL — ABNORMAL HIGH (ref 70–99)
Potassium: 4.5 mEq/L (ref 3.5–5.3)
Sodium: 142 mEq/L (ref 135–145)
Total Protein: 6.9 g/dL (ref 6.0–8.3)

## 2015-02-02 LAB — LIPID PANEL
Cholesterol: 271 mg/dL — ABNORMAL HIGH (ref 0–200)
HDL: 55 mg/dL (ref >=40)
LDL CALC: 162 mg/dL — AB (ref 0–99)
Total CHOL/HDL Ratio: 4.9 Ratio
Triglycerides: 270 mg/dL — ABNORMAL HIGH (ref <150)
VLDL: 54 mg/dL — ABNORMAL HIGH (ref 0–40)

## 2015-02-02 LAB — HEMOGLOBIN A1C
Hgb A1c MFr Bld: 5.9 % — ABNORMAL HIGH (ref <5.7)
MEAN PLASMA GLUCOSE: 123 mg/dL — AB (ref <117)

## 2015-02-03 LAB — RPR

## 2015-02-04 LAB — HIV-1 RNA QUANT-NO REFLEX-BLD: HIV-1 RNA Quant, Log: <1.3 {Log} (ref <1.30)

## 2015-02-04 LAB — T-HELPER CELL (CD4) - (RCID CLINIC ONLY)
CD4 % Helper T Cell: 46 % (ref 33–55)
CD4 T Cell Abs: 720 /uL (ref 400–2700)

## 2015-02-15 ENCOUNTER — Ambulatory Visit: Payer: Self-pay | Admitting: Internal Medicine

## 2015-02-22 ENCOUNTER — Ambulatory Visit: Payer: Self-pay | Admitting: Internal Medicine

## 2015-02-24 ENCOUNTER — Encounter: Payer: Self-pay | Admitting: Internal Medicine

## 2015-02-24 ENCOUNTER — Ambulatory Visit (INDEPENDENT_AMBULATORY_CARE_PROVIDER_SITE_OTHER): Payer: Self-pay | Admitting: Internal Medicine

## 2015-02-24 DIAGNOSIS — B20 Human immunodeficiency virus [HIV] disease: Secondary | ICD-10-CM

## 2015-02-25 NOTE — Progress Notes (Signed)
Patient ID: Edwin [name], male   DOB: November 13, 1960, 54 y.o.   MRN: [id number]          Patient Active Problem List   Diagnosis Date Noted  . Human immunodeficiency virus (HIV) disease 08/10/2006    Priority: High  . Hyperglycemia 08/03/2014    Priority: Medium  . Obesity 11/23/2008    Priority: Medium  . DYSLIPIDEMIA 08/10/2006    Priority: Medium  . Hordeolum 05/20/2013  . Hearing loss in right ear 01/03/2012  . Seasonal allergies 01/03/2011  . GYNECOMASTIA 08/29/2006  . HEPATITIS B 08/10/2006  . VENEREAL WART 08/10/2006  . DENTAL CARIES 08/10/2006  . TOBACCO USE, QUIT 08/10/2006  . PNEUMOCYSTIS PNEUMONIA 10/29/2004    Patient's Medications  New Prescriptions   No medications on file  Previous Medications   ELVITEGRAVIR-COBICISTAT-EMTRICITABINE-TENOFOVIR (GENVOYA) 150-150-200-10 MG TABS TABLET    Take 1 tablet by mouth daily with breakfast.   FISH OIL-OMEGA-3 FATTY ACIDS 1000 MG CAPSULE    Take 3 g by mouth daily.     LORATADINE (CLARITIN) 10 MG TABLET    Take 10 mg by mouth daily.  Modified Medications   No medications on file  Discontinued Medications   No medications on file    Subjective: Kru is in for his routine HIV followup visit. He denies missing any doses of his genital area. He takes it daily with breakfast. He's had no problems tolerating it. He is feeling well. He does not get any regular exercise.he does not know much about his father's health. His father died in a military accident when he was very young. His mother died of ovarian cancer. Review of Systems: Constitutional: negative Eyes: negative Ears, nose, mouth, throat, and face: negative Respiratory: negative Cardiovascular: negative Gastrointestinal: negative Genitourinary:negative  Past Medical History  Diagnosis Date  . Hyperlipidemia   . HIV infection     History  Substance Use Topics  . Smoking status: Former Smoker -- 12 years    Types: Cigarettes    Quit date:  07/31/2004  . Smokeless tobacco: Never Used  . Alcohol Use: 0.6 oz/week    1 Standard drinks or equivalent per week     Comment: wine    Family History  Problem Relation Age of Onset  . Hypertension Father   . Cancer Mother     No Known Allergies  Objective:  Blood pressure is 150/72 Body mass index is 31.29 kg/(m^2). his weight is down 8 pounds to 205.75  General: he is in no distress Oral: no oropharyngeal lesions Skin: no rash Lungs: clear Cor: regular S1 and S2 with no murmurs Abdomen: soft and nontender Joints and extremities: normal Neuro: alert with normal speech and conversation Mood: Normal. He does not appear anxious or depressed  Lab Results Lab Results  Component Value Date   WBC 4.9 02/02/2015   HGB 14.7 02/02/2015   HCT 42.2 02/02/2015   MCV 91.1 02/02/2015   PLT 180 02/02/2015    Lab Results  Component Value Date   CREATININE 1.19 02/02/2015   BUN 11 02/02/2015   NA 142 02/02/2015   K 4.5 02/02/2015   CL 104 02/02/2015   CO2 31 02/02/2015    Lab Results  Component Value Date   ALT 17 02/02/2015   AST 14 02/02/2015   ALKPHOS 81 02/02/2015   BILITOT 0.4 02/02/2015    Lab Results  Component Value Date   CHOL 271* 02/02/2015   HDL 55 02/02/2015   LDLCALC 162* 02/02/2015   TRIG  270* 02/02/2015   CHOLHDL 4.9 02/02/2015    Lab Results HIV 1 RNA QUANT (copies/mL)  Date Value  02/02/2015 <20  07/15/2014 <20  01/13/2014 <20   CD4 T CELL ABS (/uL)  Date Value  02/02/2015 720  07/15/2014 600  01/13/2014 790     Assessment: His HIV he remains under excellent control. I will continue Genvoya.  I talked to him again at length today about the importance of managing his other potential health problems such as obesity, dyslipidemia, hypertension,and hyperglycemia. He has not been keeping regular visits with his primary care physician, Dr. Gerda Diss. I encouraged him to start getting regular exercise and showed the same commitment to managing  these problems as he does to managing his HIV infection.  Plan: 1. Continue Genvoya 2. I encouraged him to get regular exercise 3. Lifestyle modification counseling provided 4. Followup here after blood work in 6 months   Cliffton Asters, MD [facility] for Infectious Disease [facility] [phone] pager   [phone] cell 02/25/2015, 9:05 AM

## 2015-06-16 ENCOUNTER — Other Ambulatory Visit: Payer: Self-pay

## 2015-07-08 ENCOUNTER — Other Ambulatory Visit (INDEPENDENT_AMBULATORY_CARE_PROVIDER_SITE_OTHER): Payer: Self-pay

## 2015-07-08 DIAGNOSIS — Z113 Encounter for screening for infections with a predominantly sexual mode of transmission: Secondary | ICD-10-CM

## 2015-07-08 DIAGNOSIS — Z79899 Other long term (current) drug therapy: Secondary | ICD-10-CM

## 2015-07-08 DIAGNOSIS — B20 Human immunodeficiency virus [HIV] disease: Secondary | ICD-10-CM

## 2015-07-08 LAB — COMPREHENSIVE METABOLIC PANEL
ALK PHOS: 94 U/L (ref 40–115)
ALT: 14 U/L (ref 9–46)
AST: 16 U/L (ref 10–35)
Albumin: 4 g/dL (ref 3.6–5.1)
BUN: 14 mg/dL (ref 7–25)
CO2: 30 mmol/L (ref 20–31)
Calcium: 9.6 mg/dL (ref 8.6–10.3)
Chloride: 105 mmol/L (ref 98–110)
Creat: 1.14 mg/dL (ref 0.70–1.33)
GLUCOSE: 99 mg/dL (ref 65–99)
Potassium: 4.4 mmol/L (ref 3.5–5.3)
Sodium: 143 mmol/L (ref 135–146)
Total Bilirubin: 0.3 mg/dL (ref 0.2–1.2)
Total Protein: 6.8 g/dL (ref 6.1–8.1)

## 2015-07-08 LAB — CBC
HCT: 42 % (ref 39.0–52.0)
Hemoglobin: 14.2 g/dL (ref 13.0–17.0)
MCH: 31.6 pg (ref 26.0–34.0)
MCHC: 33.8 g/dL (ref 30.0–36.0)
MCV: 93.3 fL (ref 78.0–100.0)
MPV: 9.1 fL (ref 8.6–12.4)
PLATELETS: 213 10*3/uL (ref 150–400)
RBC: 4.5 MIL/uL (ref 4.22–5.81)
RDW: 14.6 % (ref 11.5–15.5)
WBC: 4.8 10*3/uL (ref 4.0–10.5)

## 2015-07-08 LAB — LIPID PANEL
Cholesterol: 255 mg/dL — ABNORMAL HIGH (ref 125–200)
HDL: 47 mg/dL (ref >=40)
LDL Cholesterol: 137 mg/dL — ABNORMAL HIGH (ref <130)
Total CHOL/HDL Ratio: 5.4 Ratio — ABNORMAL HIGH (ref <=5.0)
Triglycerides: 356 mg/dL — ABNORMAL HIGH (ref <150)
VLDL: 71 mg/dL — AB (ref <30)

## 2015-07-09 LAB — HIV-1 RNA QUANT-NO REFLEX-BLD
HIV 1 RNA Quant: <20 copies/mL (ref <20)
HIV-1 RNA Quant, Log: <1.3 Log copies/mL (ref <1.30)

## 2015-07-09 LAB — T-HELPER CELL (CD4) - (RCID CLINIC ONLY)
CD4 T CELL ABS: 860 /uL (ref 400–2700)
CD4 T CELL HELPER: 50 % (ref 33–55)

## 2015-07-09 LAB — RPR

## 2015-07-12 ENCOUNTER — Other Ambulatory Visit: Payer: Self-pay

## 2015-07-22 ENCOUNTER — Ambulatory Visit (INDEPENDENT_AMBULATORY_CARE_PROVIDER_SITE_OTHER): Payer: Self-pay | Admitting: Internal Medicine

## 2015-07-22 ENCOUNTER — Encounter: Payer: Self-pay | Admitting: Internal Medicine

## 2015-07-22 VITALS — BP 138/88 | HR 77 | Temp 98.4°F | Ht 68.0 in | Wt 212.0 lb

## 2015-07-22 DIAGNOSIS — B20 Human immunodeficiency virus [HIV] disease: Secondary | ICD-10-CM

## 2015-07-22 DIAGNOSIS — Z23 Encounter for immunization: Secondary | ICD-10-CM

## 2015-07-22 NOTE — Assessment & Plan Note (Signed)
His HIV infection remains under excellent control. I asked him to switch and take his Genvoya each evening with his dinner meal. He states that he always eats dinner between 6 and 7. He is also home at that time. He will follow-up after lab work in 6 months.

## 2015-07-22 NOTE — Progress Notes (Signed)
Patient Active Problem List   Diagnosis Date Noted  . Human immunodeficiency virus (HIV) disease (HCC) 08/10/2006    Priority: High  . Hyperglycemia 08/03/2014    Priority: Medium  . Obesity 11/23/2008    Priority: Medium  . DYSLIPIDEMIA 08/10/2006    Priority: Medium  . Hordeolum 05/20/2013  . Hearing loss in right ear 01/03/2012  . Seasonal allergies 01/03/2011  . GYNECOMASTIA 08/29/2006  . HEPATITIS B 08/10/2006  . VENEREAL WART 08/10/2006  . DENTAL CARIES 08/10/2006  . TOBACCO USE, QUIT 08/10/2006  . PNEUMOCYSTIS PNEUMONIA 10/29/2004    Patient's Medications  New Prescriptions   No medications on file  Previous Medications   ELVITEGRAVIR-COBICISTAT-EMTRICITABINE-TENOFOVIR (GENVOYA) 150-150-200-10 MG TABS TABLET    Take 1 tablet by mouth daily with breakfast.   FISH OIL-OMEGA-3 FATTY ACIDS 1000 MG CAPSULE    Take 3 g by mouth daily.     LORATADINE (CLARITIN) 10 MG TABLET    Take 10 mg by mouth daily.  Modified Medications   No medications on file  Discontinued Medications   No medications on file    Subjective: Edwin [name] is in for his routine HIV follow-up visit. He recalls missing only one dose of his Genvoya since his last visit. That occurred when he had a bout of gastroenteritis with nausea and vomiting. He did not take his Genvoya because he was worried that he would not be able to keep it down. He has no trouble tolerating it. He usually takes it each morning with breakfast but occasionally will take it later in the day if he forgets or if he did not eat breakfast.   Review of Systems: Review of Systems  Constitutional: Negative for fever, chills, weight loss, malaise/fatigue and diaphoresis.  HENT: Negative for sore throat.   Respiratory: Negative for cough, sputum production and shortness of breath.   Cardiovascular: Negative for chest pain.  Gastrointestinal: Negative for nausea, vomiting and diarrhea.  Musculoskeletal: Negative for joint pain.    Skin: Negative for rash.  Psychiatric/Behavioral: Negative for depression and substance abuse. The patient is not nervous/anxious.     Past Medical History  Diagnosis Date  . Hyperlipidemia   . HIV infection [facility])     Social History  Substance Use Topics  . Smoking status: Former Smoker -- 12 years    Types: Cigarettes    Quit date: 07/31/2004  . Smokeless tobacco: Never Used  . Alcohol Use: 0.6 oz/week    1 Standard drinks or equivalent per week     Comment: wine    Family History  Problem Relation Age of Onset  . Hypertension Father   . Cancer Mother     No Known Allergies  Objective:  Filed Vitals:   07/22/15 1551 07/22/15 1556  BP: 149/79 138/88  Pulse: 73 77  Temp: 98.4 F (36.9 C)   TempSrc: Oral   Height:  (1.727 m)   Weight: 212 lb (96.163 kg)    Body mass index is 32.24 kg/(m^2).  Physical Exam  Constitutional: He is oriented to person, place, and time.  He is well dressed and in good spirits.  HENT:  Mouth/Throat: No oropharyngeal exudate.  Eyes: Conjunctivae are normal.  Cardiovascular: Normal rate and regular rhythm.   No murmur heard. Pulmonary/Chest: Breath sounds normal.  Abdominal: Soft. He exhibits no mass. There is no tenderness.  Neurological: He is alert and oriented to person, place, and time.  Skin: No rash noted.  Psychiatric: Mood and affect normal.    Lab Results Lab Results  Component Value Date   WBC 4.8 07/08/2015   HGB 14.2 07/08/2015   HCT 42.0 07/08/2015   MCV 93.3 07/08/2015   PLT 213 07/08/2015    Lab Results  Component Value Date   CREATININE 1.14 07/08/2015   BUN 14 07/08/2015   NA 143 07/08/2015   K 4.4 07/08/2015   CL 105 07/08/2015   CO2 30 07/08/2015    Lab Results  Component Value Date   ALT 14 07/08/2015   AST 16 07/08/2015   ALKPHOS 94 07/08/2015   BILITOT 0.3 07/08/2015    Lab Results  Component Value Date   CHOL 255* 07/08/2015   HDL 47 07/08/2015   LDLCALC 137* 07/08/2015   TRIG  356* 07/08/2015   CHOLHDL 5.4* 07/08/2015    Lab Results HIV 1 RNA QUANT (copies/mL)  Date Value  07/08/2015 <20  02/02/2015 <20  07/15/2014 <20   CD4 T CELL ABS (/uL)  Date Value  07/08/2015 860  02/02/2015 720  07/15/2014 600      Problem List Items Addressed This Visit      High   Human immunodeficiency virus (HIV) disease (HCC)    His HIV infection remains under excellent control. I asked him to switch and take his Genvoya each evening with his dinner meal. He states that he always eats dinner between 6 and 7. He is also home at that time. He will follow-up after lab work in 6 months.      Relevant Orders   T-helper cell (CD4)- ([facility] clinic only)   HIV 1 RNA quant-no reflex-bld   CBC   Comprehensive metabolic panel   Lipid panel   RPR        Cliffton AstersJohn Jonte Shiller, MD [facility] [phone] pager   [phone] cell 07/22/2015, 4:11 PM

## 2015-08-11 ENCOUNTER — Other Ambulatory Visit: Payer: Self-pay | Admitting: Internal Medicine

## 2015-08-11 DIAGNOSIS — B20 Human immunodeficiency virus [HIV] disease: Secondary | ICD-10-CM

## 2016-02-03 ENCOUNTER — Other Ambulatory Visit: Payer: Self-pay | Admitting: Internal Medicine

## 2016-02-14 ENCOUNTER — Other Ambulatory Visit (INDEPENDENT_AMBULATORY_CARE_PROVIDER_SITE_OTHER): Payer: Self-pay

## 2016-02-14 DIAGNOSIS — B20 Human immunodeficiency virus [HIV] disease: Secondary | ICD-10-CM

## 2016-02-14 DIAGNOSIS — Z79899 Other long term (current) drug therapy: Secondary | ICD-10-CM

## 2016-02-14 DIAGNOSIS — Z113 Encounter for screening for infections with a predominantly sexual mode of transmission: Secondary | ICD-10-CM

## 2016-02-14 LAB — CBC
HEMATOCRIT: 42.4 % (ref 38.5–50.0)
HEMOGLOBIN: 14.4 g/dL (ref 13.2–17.1)
MCH: 31.3 pg (ref 27.0–33.0)
MCHC: 34 g/dL (ref 32.0–36.0)
MCV: 92.2 fL (ref 80.0–100.0)
MPV: 9.5 fL (ref 7.5–12.5)
Platelets: 154 10*3/uL (ref 140–400)
RBC: 4.6 MIL/uL (ref 4.20–5.80)
RDW: 15.1 % — ABNORMAL HIGH (ref 11.0–15.0)
WBC: 4 10*3/uL (ref 3.8–10.8)

## 2016-02-14 LAB — LIPID PANEL
CHOL/HDL RATIO: 5.2 ratio — AB (ref <=5.0)
Cholesterol: 263 mg/dL — ABNORMAL HIGH (ref 125–200)
HDL: 51 mg/dL (ref >=40)
LDL Cholesterol: 152 mg/dL — ABNORMAL HIGH (ref <130)
TRIGLYCERIDES: 302 mg/dL — AB (ref <150)
VLDL: 60 mg/dL — ABNORMAL HIGH (ref <30)

## 2016-02-14 LAB — COMPREHENSIVE METABOLIC PANEL
ALBUMIN: 4.1 g/dL (ref 3.6–5.1)
ALK PHOS: 87 U/L (ref 40–115)
ALT: 12 U/L (ref 9–46)
AST: 13 U/L (ref 10–35)
BILIRUBIN TOTAL: 0.4 mg/dL (ref 0.2–1.2)
BUN: 13 mg/dL (ref 7–25)
CALCIUM: 9 mg/dL (ref 8.6–10.3)
CO2: 22 mmol/L (ref 20–31)
Chloride: 106 mmol/L (ref 98–110)
Creat: 1.27 mg/dL (ref 0.70–1.33)
Glucose, Bld: 140 mg/dL — ABNORMAL HIGH (ref 65–99)
POTASSIUM: 4.2 mmol/L (ref 3.5–5.3)
Sodium: 139 mmol/L (ref 135–146)
TOTAL PROTEIN: 6.6 g/dL (ref 6.1–8.1)

## 2016-02-15 LAB — T-HELPER CELL (CD4) - (RCID CLINIC ONLY)
CD4 % Helper T Cell: 43 % (ref 33–55)
CD4 T Cell Abs: 620 /uL (ref 400–2700)

## 2016-02-15 LAB — HIV-1 RNA QUANT-NO REFLEX-BLD
HIV 1 RNA Quant: <20 copies/mL (ref <20)
HIV-1 RNA Quant, Log: <1.3 Log copies/mL (ref <1.30)

## 2016-02-15 LAB — RPR

## 2016-02-17 ENCOUNTER — Encounter: Payer: Self-pay | Admitting: Internal Medicine

## 2016-02-28 ENCOUNTER — Ambulatory Visit: Payer: Self-pay | Admitting: Internal Medicine

## 2016-03-14 ENCOUNTER — Ambulatory Visit: Payer: Self-pay | Admitting: Internal Medicine

## 2016-05-17 ENCOUNTER — Ambulatory Visit: Payer: Self-pay | Admitting: Internal Medicine

## 2016-05-23 ENCOUNTER — Ambulatory Visit (INDEPENDENT_AMBULATORY_CARE_PROVIDER_SITE_OTHER): Payer: Self-pay | Admitting: Internal Medicine

## 2016-05-23 ENCOUNTER — Encounter: Payer: Self-pay | Admitting: Internal Medicine

## 2016-05-23 VITALS — BP 156/90 | HR 64 | Temp 98.1°F | Ht 68.0 in | Wt 212.8 lb

## 2016-05-23 DIAGNOSIS — Z2911 Encounter for prophylactic immunotherapy for respiratory syncytial virus (RSV): Secondary | ICD-10-CM

## 2016-05-23 DIAGNOSIS — E785 Hyperlipidemia, unspecified: Secondary | ICD-10-CM

## 2016-05-23 DIAGNOSIS — R739 Hyperglycemia, unspecified: Secondary | ICD-10-CM

## 2016-05-23 DIAGNOSIS — B20 Human immunodeficiency virus [HIV] disease: Secondary | ICD-10-CM

## 2016-05-23 DIAGNOSIS — Z23 Encounter for immunization: Secondary | ICD-10-CM

## 2016-05-23 NOTE — Assessment & Plan Note (Signed)
He continues to have dyslipidemia and no longer has a primary care provider. We will help get him a new primary care provider in our network.

## 2016-05-23 NOTE — Assessment & Plan Note (Signed)
His last blood sugar was up to 140. I encouraged him to try to get some regular exercise and control his weight. His BMI remains stable but elevated at 32.

## 2016-05-23 NOTE — Assessment & Plan Note (Signed)
His infection remains under excellent control. I met with him today with our infectious disease pharmacist and instructed him to take his Genvoya each day after lunch rather than on an empty stomach at 3 PM. He will get repeat lab work today in follow-up in 6 months.

## 2016-05-23 NOTE — Progress Notes (Signed)
Patient Active Problem List   Diagnosis Date Noted  . Human immunodeficiency virus (HIV) disease ([place]) 08/10/2006    Priority: High  . Hyperglycemia 08/03/2014    Priority: Medium  . Obesity 11/23/2008    Priority: Medium  . Dyslipidemia 08/10/2006    Priority: Medium  . Hordeolum 05/20/2013  . Hearing loss in right ear 01/03/2012  . Seasonal allergies 01/03/2011  . GYNECOMASTIA 08/29/2006  . HEPATITIS B 08/10/2006  . VENEREAL WART 08/10/2006  . DENTAL CARIES 08/10/2006  . TOBACCO USE, QUIT 08/10/2006  . PNEUMOCYSTIS PNEUMONIA 10/29/2004    Patient's Medications  New Prescriptions   No medications on file  Previous Medications   FISH OIL-OMEGA-3 FATTY ACIDS 1000 MG CAPSULE    Take 3 g by mouth daily.     GENVOYA 150-150-200-10 MG TABS TABLET    TAKE 1 TABLET BY MOUTH EVERY MORNING WITH BREAKFAST   LORATADINE (CLARITIN) 10 MG TABLET    Take 10 mg by mouth daily.  Modified Medications   No medications on file  Discontinued Medications   No medications on file    Subjective: Derwood is in for his routine HIV follow-up visit. He denies missing any doses of his Genvoya since his last visit. He takes it each afternoon about 3 PM on an empty stomach. He always eats lunch around 12 to 12:30 PM. He is doing well. His only recent concern is that he has noticed new moles popping up in various places. He does not have a primary care provider.  Review of Systems: Review of Systems  Constitutional: Negative for chills, diaphoresis, fever, malaise/fatigue and weight loss.  HENT: Negative for sore throat.   Respiratory: Negative for cough, sputum production and shortness of breath.   Cardiovascular: Negative for chest pain.  Gastrointestinal: Negative for diarrhea, nausea and vomiting.  Genitourinary: Negative for dysuria and frequency.  Musculoskeletal: Negative for joint pain and myalgias.  Skin: Negative for rash.  Neurological: Negative for dizziness and headaches.    Psychiatric/Behavioral: Negative for depression and substance abuse. The patient is not nervous/anxious.     Past Medical History:  Diagnosis Date  . HIV infection ([place])   . Hyperlipidemia     Social History  Substance Use Topics  . Smoking status: Former Smoker    Years: 12.00    Types: Cigarettes    Quit date: 07/31/2004  . Smokeless tobacco: Never Used  . Alcohol use 0.6 oz/week    1 Standard drinks or equivalent per week     Comment: wine    Family History  Problem Relation Age of Onset  . Hypertension Father   . Cancer Mother     No Known Allergies  Objective:  Vitals:   05/23/16 1615  BP: (!) 156/90  Pulse: 64  Temp: 98.1 F (36.7 C)  TempSrc: Oral  Weight: 212 lb 12 oz (96.5 kg)  Height: '5\' 8"'$  (1.727 m)   Body mass index is 32.35 kg/m.  Physical Exam  Constitutional: He is oriented to person, place, and time.  He is in good spirits.  HENT:  Mouth/Throat: No oropharyngeal exudate.  Eyes: Conjunctivae are normal.  Cardiovascular: Normal rate and regular rhythm.   No murmur heard. Pulmonary/Chest: Breath sounds normal.  Abdominal: Soft. He exhibits no mass. There is no tenderness.  Musculoskeletal: Normal range of motion.  Neurological: He is alert and oriented to person, place, and time.  Skin: No rash noted.  He has multiple small  moles on his arms and neck and upper back. I do not see any that have unusual warning signs.  Psychiatric: Mood and affect normal.    Lab Results Lab Results  Component Value Date   WBC 4.0 02/14/2016   HGB 14.4 02/14/2016   HCT 42.4 02/14/2016   MCV 92.2 02/14/2016   PLT 154 02/14/2016    Lab Results  Component Value Date   CREATININE 1.27 02/14/2016   BUN 13 02/14/2016   NA 139 02/14/2016   K 4.2 02/14/2016   CL 106 02/14/2016   CO2 22 02/14/2016    Lab Results  Component Value Date   ALT 12 02/14/2016   AST 13 02/14/2016   ALKPHOS 87 02/14/2016   BILITOT 0.4 02/14/2016    Lab Results  Component  Value Date   CHOL 263 (H) 02/14/2016   HDL 51 02/14/2016   LDLCALC 152 (H) 02/14/2016   TRIG 302 (H) 02/14/2016   CHOLHDL 5.2 (H) 02/14/2016   HIV 1 RNA Quant (copies/mL)  Date Value  02/14/2016 <20  07/08/2015 <20  02/02/2015 <20   CD4 T Cell Abs (/uL)  Date Value  02/14/2016 620  07/08/2015 860  02/02/2015 720     Problem List Items Addressed This Visit      High   Human immunodeficiency virus (HIV) disease ([place])    His infection remains under excellent control. I met with him today with our infectious disease pharmacist and instructed him to take his Genvoya each day after lunch rather than on an empty stomach at 3 PM. He will get repeat lab work today in follow-up in 6 months.      Relevant Orders   T-helper cell (CD4)- ([facility] clinic only)   HIV 1 RNA quant-no reflex-bld     Medium   Dyslipidemia    He continues to have dyslipidemia and no longer has a primary care provider. We will help get him a new primary care provider in our network.      Hyperglycemia    His last blood sugar was up to 140. I encouraged him to try to get some regular exercise and control his weight. His BMI remains stable but elevated at 32.       Other Visit Diagnoses    Need for zoster vaccination    -  Primary   Relevant Orders   Varicella-zoster vaccine subcutaneous (Completed)        Michel Bickers, MD [facility] for [place] [phone] pager   [phone] cell 05/23/2016, 4:45 PM

## 2016-06-02 ENCOUNTER — Other Ambulatory Visit: Payer: Self-pay | Admitting: Internal Medicine

## 2016-08-30 ENCOUNTER — Ambulatory Visit: Payer: Self-pay

## 2016-08-31 ENCOUNTER — Ambulatory Visit: Payer: BC Managed Care – PPO

## 2016-09-05 ENCOUNTER — Encounter: Payer: Self-pay | Admitting: Internal Medicine

## 2016-10-12 ENCOUNTER — Other Ambulatory Visit: Payer: BC Managed Care – PPO

## 2016-10-12 ENCOUNTER — Other Ambulatory Visit: Payer: Self-pay | Admitting: Internal Medicine

## 2016-10-12 ENCOUNTER — Other Ambulatory Visit (HOSPITAL_COMMUNITY)
Admission: RE | Admit: 2016-10-12 | Discharge: 2016-10-12 | Disposition: A | Discharge status: Home / Self Care | Payer: BC Managed Care – PPO | Source: Ambulatory Visit | Attending: Infectious Disease | Admitting: Infectious Disease

## 2016-10-12 DIAGNOSIS — B20 Human immunodeficiency virus [HIV] disease: Secondary | ICD-10-CM

## 2016-10-12 DIAGNOSIS — Z113 Encounter for screening for infections with a predominantly sexual mode of transmission: Secondary | ICD-10-CM

## 2016-10-12 LAB — CBC WITH DIFFERENTIAL/PLATELET
BASOS ABS: 60 {cells}/uL (ref 0–200)
Basophils Relative: 1 %
EOS ABS: 120 {cells}/uL (ref 15–500)
Eosinophils Relative: 2 %
HCT: 42.6 % (ref 38.5–50.0)
Hemoglobin: 14.2 g/dL (ref 13.2–17.1)
LYMPHS ABS: 1980 {cells}/uL (ref 850–3900)
LYMPHS PCT: 33 %
MCH: 31.3 pg (ref 27.0–33.0)
MCHC: 33.3 g/dL (ref 32.0–36.0)
MCV: 94 fL (ref 80.0–100.0)
MONOS PCT: 12 %
MPV: 9.2 fL (ref 7.5–12.5)
Monocytes Absolute: 720 cells/uL (ref 200–950)
NEUTROS ABS: 3120 {cells}/uL (ref 1500–7800)
Neutrophils Relative %: 52 %
Platelets: 185 10*3/uL (ref 140–400)
RBC: 4.53 MIL/uL (ref 4.20–5.80)
RDW: 15.2 % — AB (ref 11.0–15.0)
WBC: 6 10*3/uL (ref 3.8–10.8)

## 2016-10-12 LAB — COMPREHENSIVE METABOLIC PANEL
ALBUMIN: 4.2 g/dL (ref 3.6–5.1)
ALK PHOS: 96 U/L (ref 40–115)
ALT: 14 U/L (ref 9–46)
AST: 16 U/L (ref 10–35)
BILIRUBIN TOTAL: 0.4 mg/dL (ref 0.2–1.2)
BUN: 11 mg/dL (ref 7–25)
CALCIUM: 9.6 mg/dL (ref 8.6–10.3)
CO2: 26 mmol/L (ref 20–31)
Chloride: 106 mmol/L (ref 98–110)
Creat: 1.06 mg/dL (ref 0.70–1.33)
Glucose, Bld: 74 mg/dL (ref 65–99)
POTASSIUM: 4.6 mmol/L (ref 3.5–5.3)
Sodium: 139 mmol/L (ref 135–146)
Total Protein: 6.7 g/dL (ref 6.1–8.1)

## 2016-10-13 LAB — T-HELPER CELL (CD4) - (RCID CLINIC ONLY)
CD4 % Helper T Cell: 41 % (ref 33–55)
CD4 T CELL ABS: 900 /uL (ref 400–2700)

## 2016-10-14 LAB — HIV-1 RNA QUANT-NO REFLEX-BLD
HIV 1 RNA QUANT: NOT DETECTED {copies}/mL
HIV-1 RNA Quant, Log: <1.3 Log copies/mL

## 2016-10-16 LAB — URINE CYTOLOGY ANCILLARY ONLY
CHLAMYDIA, DNA PROBE: NEGATIVE
NEISSERIA GONORRHEA: NEGATIVE

## 2016-10-24 ENCOUNTER — Telehealth: Payer: Self-pay | Admitting: Pharmacist

## 2016-10-24 NOTE — Telephone Encounter (Signed)
Hieu called today and asked if Nugenix interacted with his Genvoya. I checked 2 drug interaction checkers and there is no interaction between the two.

## 2016-11-08 ENCOUNTER — Ambulatory Visit (INDEPENDENT_AMBULATORY_CARE_PROVIDER_SITE_OTHER): Payer: BC Managed Care – PPO | Admitting: Internal Medicine

## 2016-11-08 ENCOUNTER — Encounter: Payer: Self-pay | Admitting: Internal Medicine

## 2016-11-08 DIAGNOSIS — B20 Human immunodeficiency virus [HIV] disease: Secondary | ICD-10-CM

## 2016-11-08 DIAGNOSIS — E785 Hyperlipidemia, unspecified: Secondary | ICD-10-CM | POA: Diagnosis not present

## 2016-11-08 DIAGNOSIS — R739 Hyperglycemia, unspecified: Secondary | ICD-10-CM

## 2016-11-08 NOTE — Progress Notes (Signed)
Patient Active Problem List   Diagnosis Date Noted  . Human immunodeficiency virus (HIV) disease (HCC) 08/10/2006    Priority: High  . Hyperglycemia 08/03/2014    Priority: Medium  . Obesity 11/23/2008    Priority: Medium  . Dyslipidemia 08/10/2006    Priority: Medium  . Hordeolum 05/20/2013  . Hearing loss in right ear 01/03/2012  . Seasonal allergies 01/03/2011  . GYNECOMASTIA 08/29/2006  . HEPATITIS B 08/10/2006  . VENEREAL WART 08/10/2006  . DENTAL CARIES 08/10/2006  . TOBACCO USE, QUIT 08/10/2006  . PNEUMOCYSTIS PNEUMONIA 10/29/2004    Patient's Medications  New Prescriptions   No medications on file  Previous Medications   FISH OIL-OMEGA-3 FATTY ACIDS 1000 MG CAPSULE    Take 3 g by mouth daily.     GENVOYA 150-150-200-10 MG TABS TABLET    TAKE 1 TABLET BY MOUTH EVERY MORNING WITH BREAKFAST   LORATADINE (CLARITIN) 10 MG TABLET    Take 10 mg by mouth daily.  Modified Medications   No medications on file  Discontinued Medications   No medications on file    Subjective: Edwin [name] he is in for his routine HIV follow-up visit. He has had no problems obtaining, taking or tolerating his Genvoya. He takes it each morning with breakfast. He does not recall missing any doses. He does not get any regular exercise outside of work. He has been trying to lose some weight gradually by cutting down on his portion size during meals He is eating breakfast, lunch and dinner. He will be seeing his new primary care provider soon.   Review of Systems: Review of Systems  Constitutional: Negative for chills, diaphoresis, fever, malaise/fatigue and weight loss.  HENT: Negative for sore throat.   Respiratory: Negative for cough, sputum production and shortness of breath.   Cardiovascular: Negative for chest pain.  Gastrointestinal: Negative for abdominal pain, diarrhea, heartburn, nausea and vomiting.  Genitourinary: Negative for dysuria and frequency.  Musculoskeletal: Negative  for joint pain and myalgias.  Skin: Negative for rash.  Neurological: Negative for dizziness and headaches.  Psychiatric/Behavioral: Negative for depression and substance abuse. The patient is not nervous/anxious.     Past Medical History:  Diagnosis Date  . HIV infection (HCC)   . Hyperlipidemia     Social History  Substance Use Topics  . Smoking status: Former Smoker    Years: 12.00    Types: Cigarettes    Quit date: 07/31/2004  . Smokeless tobacco: Never Used  . Alcohol use 0.6 oz/week    1 Standard drinks or equivalent per week     Comment: wine    Family History  Problem Relation Age of Onset  . Hypertension Father   . Cancer Mother     No Known Allergies  Objective:  Vitals:   11/08/16 1642  BP: (!) 151/81  Pulse: 67  Temp: 97.9 F (36.6 C)  TempSrc: Oral  Weight: 209 lb (94.8 kg)  Height:  (1.727 m)   Body mass index is 31.78 kg/m.  Physical Exam  Constitutional: He is oriented to person, place, and time.  He is in good spirits.  HENT:  Mouth/Throat: No oropharyngeal exudate.  Eyes: Conjunctivae are normal.  Cardiovascular: Normal rate and regular rhythm.   No murmur heard. Pulmonary/Chest: Effort normal and breath sounds normal.  Abdominal: Soft. He exhibits no mass. There is no tenderness.  Musculoskeletal: Normal range of motion.  Neurological: He is alert and oriented to  person, place, and time.  Skin: No rash noted.  Psychiatric: Mood and affect normal.    Lab Results Lab Results  Component Value Date   WBC 6.0 10/12/2016   HGB 14.2 10/12/2016   HCT 42.6 10/12/2016   MCV 94.0 10/12/2016   PLT 185 10/12/2016    Lab Results  Component Value Date   CREATININE 1.06 10/12/2016   BUN 11 10/12/2016   NA 139 10/12/2016   K 4.6 10/12/2016   CL 106 10/12/2016   CO2 26 10/12/2016    Lab Results  Component Value Date   ALT 14 10/12/2016   AST 16 10/12/2016   ALKPHOS 96 10/12/2016   BILITOT 0.4 10/12/2016    Lab Results    Component Value Date   CHOL 263 (H) 02/14/2016   HDL 51 02/14/2016   LDLCALC 152 (H) 02/14/2016   TRIG 302 (H) 02/14/2016   CHOLHDL 5.2 (H) 02/14/2016   HIV 1 RNA Quant (copies/mL)  Date Value  10/12/2016 <20 NOT DETECTED  02/14/2016 <20  07/08/2015 <20   CD4 T Cell Abs (/uL)  Date Value  10/12/2016 900  02/14/2016 620  07/08/2015 860     Problem List Items Addressed This Visit      High   Human immunodeficiency virus (HIV) disease (HCC)    His infection is under excellent long-term control. He will continue Genvoya and follow-up after lab work in one year.      Relevant Orders   T-helper cell (CD4)- ([facility] clinic only)   HIV 1 RNA quant-no reflex-bld   CBC   Comprehensive metabolic panel   Lipid panel   RPR     Medium   Dyslipidemia    I asked him to discuss his dyslipidemia with his new primary care provider.      Hyperglycemia    His glucose was normal on his most recent lab work. He is BMI remains over 30. I congratulated him on his efforts to gradually and sustainably lose some weight.           Cliffton Asters, MD [facility] [phone] pager   [phone] cell 11/08/2016, 5:10 PM

## 2016-11-08 NOTE — Assessment & Plan Note (Signed)
His glucose was normal on his most recent lab work. He is BMI remains over 30. I congratulated him on his efforts to gradually and sustainably lose some weight.

## 2016-11-08 NOTE — Assessment & Plan Note (Signed)
His infection is under excellent long-term control. He will continue Genvoya and follow-up after lab work in one year.

## 2016-11-08 NOTE — Assessment & Plan Note (Signed)
I asked him to discuss his dyslipidemia with his new primary care provider.

## 2016-11-21 ENCOUNTER — Telehealth: Payer: Self-pay

## 2016-11-21 MED ORDER — ELVITEG-COBIC-EMTRICIT-TENOFAF 150-150-200-10 MG PO TABS: 1.0000 | ORAL_TABLET | Freq: Every day | ORAL | 5 refills | Status: DC

## 2016-11-21 NOTE — Telephone Encounter (Signed)
Transfer request from [facility]int.  He would like Genv[facility] moved to [facility] in [place].   I will send script as requested.  Edwin [name] K Jahmar Mckelvy,RN

## 2017-04-03 ENCOUNTER — Ambulatory Visit: Payer: BC Managed Care – PPO

## 2017-04-05 ENCOUNTER — Other Ambulatory Visit: Payer: Self-pay | Admitting: *Deleted

## 2017-04-05 MED ORDER — ELVITEG-COBIC-EMTRICIT-TENOFAF 150-150-200-10 MG PO TABS: 1.0000 | ORAL_TABLET | Freq: Every day | ORAL | 5 refills | Status: DC

## 2017-04-06 ENCOUNTER — Encounter: Payer: Self-pay | Admitting: Internal Medicine

## 2017-05-11 ENCOUNTER — Other Ambulatory Visit: Payer: Self-pay | Admitting: *Deleted

## 2017-05-11 ENCOUNTER — Telehealth: Payer: Self-pay | Admitting: *Deleted

## 2017-05-11 MED ORDER — ELVITEG-COBIC-EMTRICIT-TENOFAF 150-150-200-10 MG PO TABS: 1.0000 | ORAL_TABLET | Freq: Every day | ORAL | 5 refills | Status: DC

## 2017-05-11 NOTE — Telephone Encounter (Signed)
Call from [facility] for patient regimen. They had not filled medication for patient  since 09/2016. Spoke to patient and he has been receiving his [facility] [place] with [facility]. [facility] notified.

## 2017-09-04 ENCOUNTER — Ambulatory Visit: Payer: Self-pay

## 2017-09-06 ENCOUNTER — Encounter: Payer: Self-pay | Admitting: Internal Medicine

## 2017-09-06 NOTE — Addendum Note (Signed)
Addended by: Danice Dippolito F on: 09/06/2017 03:57 PM   Modules accepted: Orders  

## 2017-09-06 NOTE — Addendum Note (Signed)
Addended by: Lurlean LeydenPOOLE, TRAVIS F on: 09/06/2017 03:57 PM   Modules accepted: Orders

## 2017-09-06 NOTE — Addendum Note (Signed)
Addended by: Liv Rallis F on: 09/06/2017 03:57 PM   Modules accepted: Orders  

## 2017-09-06 NOTE — Addendum Note (Signed)
Addended by: Brittnei Jagiello F on: 09/06/2017 03:58 PM   Modules accepted: Orders  

## 2017-09-06 NOTE — Addendum Note (Signed)
Addended by: POOLE, TRAVIS F on: 09/06/2017 03:57 PM   Modules accepted: Orders  

## 2017-09-11 ENCOUNTER — Other Ambulatory Visit: Payer: Self-pay

## 2017-09-11 ENCOUNTER — Encounter: Payer: Self-pay | Admitting: Internal Medicine

## 2017-09-11 DIAGNOSIS — B20 Human immunodeficiency virus [HIV] disease: Secondary | ICD-10-CM

## 2017-09-12 LAB — COMPREHENSIVE METABOLIC PANEL
AG Ratio: 1.6 (calc) (ref 1.0–2.5)
ALBUMIN MSPROF: 4.4 g/dL (ref 3.6–5.1)
ALKALINE PHOSPHATASE (APISO): 98 U/L (ref 40–115)
ALT: 18 U/L (ref 9–46)
AST: 20 U/L (ref 10–35)
BUN: 16 mg/dL (ref 7–25)
CALCIUM: 9.7 mg/dL (ref 8.6–10.3)
CO2: 26 mmol/L (ref 20–32)
CREATININE: 1.09 mg/dL (ref 0.70–1.33)
Chloride: 104 mmol/L (ref 98–110)
Globulin: 2.7 g/dL (calc) (ref 1.9–3.7)
Glucose, Bld: 71 mg/dL (ref 65–99)
POTASSIUM: 4.6 mmol/L (ref 3.5–5.3)
Sodium: 142 mmol/L (ref 135–146)
TOTAL PROTEIN: 7.1 g/dL (ref 6.1–8.1)
Total Bilirubin: 0.3 mg/dL (ref 0.2–1.2)

## 2017-09-12 LAB — RPR: RPR Ser Ql: NONREACTIVE

## 2017-09-12 LAB — CBC
HEMATOCRIT: 41 % (ref 38.5–50.0)
Hemoglobin: 14.4 g/dL (ref 13.2–17.1)
MCH: 31.9 pg (ref 27.0–33.0)
MCHC: 35.1 g/dL (ref 32.0–36.0)
MCV: 90.7 fL (ref 80.0–100.0)
MPV: 9.6 fL (ref 7.5–12.5)
Platelets: 196 10*3/uL (ref 140–400)
RBC: 4.52 10*6/uL (ref 4.20–5.80)
RDW: 13.5 % (ref 11.0–15.0)
WBC: 5.3 10*3/uL (ref 3.8–10.8)

## 2017-09-12 LAB — LIPID PANEL
CHOL/HDL RATIO: 4.5 (calc) (ref <5.0)
CHOLESTEROL: 259 mg/dL — AB (ref <200)
HDL: 58 mg/dL (ref >40)
LDL CHOLESTEROL (CALC): 147 mg/dL — AB
Non-HDL Cholesterol (Calc): 201 mg/dL (calc) — ABNORMAL HIGH (ref <130)
TRIGLYCERIDES: 348 mg/dL — AB (ref <150)

## 2017-09-12 LAB — SPECIMEN COMPROMISED

## 2017-09-12 LAB — T-HELPER CELL (CD4) - (RCID CLINIC ONLY)
CD4 % Helper T Cell: 48 % (ref 33–55)
CD4 T CELL ABS: 950 /uL (ref 400–2700)

## 2017-09-13 LAB — HIV-1 RNA QUANT-NO REFLEX-BLD
HIV 1 RNA QUANT: DETECTED {copies}/mL — AB
HIV-1 RNA QUANT, LOG: DETECTED {Log_copies}/mL — AB

## 2017-09-25 ENCOUNTER — Encounter: Payer: Self-pay | Admitting: Internal Medicine

## 2017-09-25 ENCOUNTER — Ambulatory Visit (INDEPENDENT_AMBULATORY_CARE_PROVIDER_SITE_OTHER): Payer: Self-pay | Admitting: Internal Medicine

## 2017-09-25 DIAGNOSIS — B20 Human immunodeficiency virus [HIV] disease: Secondary | ICD-10-CM

## 2017-09-25 DIAGNOSIS — E785 Hyperlipidemia, unspecified: Secondary | ICD-10-CM

## 2017-09-25 DIAGNOSIS — Z23 Encounter for immunization: Secondary | ICD-10-CM

## 2017-09-25 NOTE — Progress Notes (Signed)
Patient Active Problem List   Diagnosis Date Noted  . Human immunodeficiency virus (HIV) disease (HCC) 08/10/2006    Priority: High  . Hyperglycemia 08/03/2014    Priority: Medium  . Obesity 11/23/2008    Priority: Medium  . Dyslipidemia 08/10/2006    Priority: Medium  . Hordeolum 05/20/2013  . Hearing loss in right ear 01/03/2012  . Seasonal allergies 01/03/2011  . GYNECOMASTIA 08/29/2006  . HEPATITIS B 08/10/2006  . VENEREAL WART 08/10/2006  . DENTAL CARIES 08/10/2006  . TOBACCO USE, QUIT 08/10/2006  . PNEUMOCYSTIS PNEUMONIA 10/29/2004    Patient's Medications  New Prescriptions   No medications on file  Previous Medications   ELVITEGRAVIR-COBICISTAT-EMTRICITABINE-TENOFOVIR (GENVOYA) 150-150-200-10 MG TABS TABLET    Take 1 tablet by mouth daily with breakfast.   FISH OIL-OMEGA-3 FATTY ACIDS 1000 MG CAPSULE    Take 3 g by mouth daily.     LORATADINE (CLARITIN) 10 MG TABLET    Take 10 mg by mouth daily.  Modified Medications   No medications on file  Discontinued Medications   No medications on file    Subjective: Edwin [name] is in for his routine visit.  As usual he never misses a single dose of his Genvoya.  He takes it each morning with breakfast.  He is not getting any regular exercise other than walking his dog.  He has not called the primary care doctor in [facility] that we asked him to following his last visit.  Review of Systems: Review of Systems  Constitutional: Negative for chills, diaphoresis, fever, malaise/fatigue and weight loss.  HENT: Negative for sore throat.   Respiratory: Negative for cough, sputum production and shortness of breath.   Cardiovascular: Negative for chest pain.  Gastrointestinal: Negative for abdominal pain, diarrhea, heartburn, nausea and vomiting.  Genitourinary: Negative for dysuria and frequency.  Musculoskeletal: Negative for joint pain and myalgias.  Skin: Negative for rash.       He says that his scalp has been  dry recently.  Neurological: Negative for dizziness and headaches.  Psychiatric/Behavioral: Negative for depression and substance abuse. The patient is not nervous/anxious.     Past Medical History:  Diagnosis Date  . HIV infection (HCC)   . Hyperlipidemia     Social History   Tobacco Use  . Smoking status: Former Smoker    Years: 12.00    Types: Cigarettes    Last attempt to quit: 07/31/2004    Years since quitting: 13.1  . Smokeless tobacco: Never Used  Substance Use Topics  . Alcohol use: Yes    Alcohol/week: 0.6 oz    Types: 1 Standard drinks or equivalent per week    Comment: wine  . Drug use: No    Family History  Problem Relation Age of Onset  . Hypertension Father   . Cancer Mother     No Known Allergies  Health Maintenance  Topic Date Due  . COLONOSCOPY  07/30/2009  . INFLUENZA VACCINE  02/28/2017  . TETANUS/TDAP  10/26/2022  . Hepatitis C Screening  Completed  . HIV Screening  Completed    Objective:  Vitals:   09/25/17 1053  BP: (!) 155/74  Pulse: 71  Temp: 98.3 F (36.8 C)  TempSrc: Oral  Weight: 205 lb (93 kg)   Body mass index is 31.17 kg/m.  Physical Exam  Constitutional: He is oriented to person, place, and time.  He is in good spirits as usual.  HENT:  Mouth/Throat:  No oropharyngeal exudate.  Eyes: Conjunctivae are normal.  Cardiovascular: Normal rate and regular rhythm.  No murmur heard. Pulmonary/Chest: Effort normal and breath sounds normal. He has no wheezes. He has no rales.  Abdominal: Soft. He exhibits no mass. There is no tenderness.  Musculoskeletal: Normal range of motion.  Neurological: He is alert and oriented to person, place, and time.  Skin: No rash noted.  Psychiatric: Mood and affect normal.    Lab Results Lab Results  Component Value Date   WBC 5.3 09/11/2017   HGB 14.4 09/11/2017   HCT 41.0 09/11/2017   MCV 90.7 09/11/2017   PLT 196 09/11/2017    Lab Results  Component Value Date   CREATININE 1.09  09/11/2017   BUN 16 09/11/2017   NA 142 09/11/2017   K 4.6 09/11/2017   CL 104 09/11/2017   CO2 26 09/11/2017    Lab Results  Component Value Date   ALT 18 09/11/2017   AST 20 09/11/2017   ALKPHOS 96 10/12/2016   BILITOT 0.3 09/11/2017    Lab Results  Component Value Date   CHOL 259 (H) 09/11/2017   HDL 58 09/11/2017   LDLCALC 152 (H) 02/14/2016   TRIG 348 (H) 09/11/2017   CHOLHDL 4.5 09/11/2017   Lab Results  Component Value Date   LABRPR NON-REACTIVE 09/11/2017   HIV 1 RNA Quant (copies/mL)  Date Value  09/11/2017 <20 DETECTED (A)  10/12/2016 <20 NOT DETECTED  02/14/2016 <20   CD4 T Cell Abs (/uL)  Date Value  09/11/2017 950  10/12/2016 900  02/14/2016 620     Problem List Items Addressed This Visit      High   Human immunodeficiency virus (HIV) disease (HCC)    His infection remains under excellent, long-term control.  He will continue Genvoya.  He would like to go back to visits every 6 months.      Relevant Orders   T-helper cell (CD4)- ([facility] clinic only)   HIV 1 RNA quant-no reflex-bld     Medium   Dyslipidemia    He does have dyslipidemia that is not at goal.  I asked him to contact his new primary care provider for a visit.      Obesity    I talked him again about the importance of regular exercise.           Cliffton AstersJohn Berenice Oehlert, MD [facility] [phone] pager   [phone] cell 09/25/2017, 11:11 AM

## 2017-09-25 NOTE — Assessment & Plan Note (Signed)
His infection remains under excellent, long-term control.  He will continue Genvoya.  He would like to go back to visits every 6 months.

## 2017-09-25 NOTE — Assessment & Plan Note (Signed)
He does have dyslipidemia that is not at goal.  I asked him to contact his new primary care provider for a visit.

## 2017-09-25 NOTE — Assessment & Plan Note (Signed)
I talked him again about the importance of regular exercise.

## 2018-03-20 ENCOUNTER — Other Ambulatory Visit: Payer: Medicaid Other

## 2018-04-03 ENCOUNTER — Encounter: Payer: Medicaid Other | Admitting: Internal Medicine

## 2018-04-05 ENCOUNTER — Other Ambulatory Visit: Payer: Self-pay | Admitting: Internal Medicine

## 2018-04-16 ENCOUNTER — Other Ambulatory Visit: Payer: Self-pay | Admitting: *Deleted

## 2018-04-16 ENCOUNTER — Other Ambulatory Visit: Payer: Medicaid Other

## 2018-04-16 DIAGNOSIS — B20 Human immunodeficiency virus [HIV] disease: Secondary | ICD-10-CM

## 2018-04-16 MED ORDER — ELVITEG-COBIC-EMTRICIT-TENOFAF 150-150-200-10 MG PO TABS: ORAL_TABLET | ORAL | 0 refills | Status: DC

## 2018-04-17 ENCOUNTER — Encounter: Payer: Self-pay | Admitting: Internal Medicine

## 2018-04-17 LAB — T-HELPER CELL (CD4) - (RCID CLINIC ONLY)
CD4 % Helper T Cell: 46 % (ref 33–55)
CD4 T CELL ABS: 840 /uL (ref 400–2700)

## 2018-04-18 LAB — HIV-1 RNA QUANT-NO REFLEX-BLD
HIV 1 RNA QUANT: NOT DETECTED {copies}/mL
HIV-1 RNA QUANT, LOG: NOT DETECTED {Log_copies}/mL

## 2018-04-30 ENCOUNTER — Ambulatory Visit (INDEPENDENT_AMBULATORY_CARE_PROVIDER_SITE_OTHER): Payer: Medicaid Other | Admitting: Internal Medicine

## 2018-04-30 ENCOUNTER — Encounter: Payer: Self-pay | Admitting: Internal Medicine

## 2018-04-30 VITALS — BP 159/78 | HR 76 | Temp 98.0°F | Ht 70.0 in | Wt 208.0 lb

## 2018-04-30 DIAGNOSIS — E785 Hyperlipidemia, unspecified: Secondary | ICD-10-CM

## 2018-04-30 DIAGNOSIS — B20 Human immunodeficiency virus [HIV] disease: Secondary | ICD-10-CM

## 2018-04-30 DIAGNOSIS — Z23 Encounter for immunization: Secondary | ICD-10-CM

## 2018-04-30 DIAGNOSIS — E66812 Obesity, class 2: Secondary | ICD-10-CM

## 2018-04-30 NOTE — Assessment & Plan Note (Signed)
I talked to him again about lifestyle modification.  She will get regular exercise PCP as soon as he has health insurance coverage.   

## 2018-04-30 NOTE — Progress Notes (Signed)
Patient Active Problem List   Diagnosis Date Noted  . Human immunodeficiency virus (HIV) disease (HCC) 08/10/2006    Priority: High  . Hyperglycemia 08/03/2014    Priority: Medium  . Obesity 11/23/2008    Priority: Medium  . Dyslipidemia 08/10/2006    Priority: Medium  . Hordeolum 05/20/2013  . Hearing loss in right ear 01/03/2012  . Seasonal allergies 01/03/2011  . GYNECOMASTIA 08/29/2006  . HEPATITIS B 08/10/2006  . VENEREAL WART 08/10/2006  . DENTAL CARIES 08/10/2006  . TOBACCO USE, QUIT 08/10/2006  . PNEUMOCYSTIS PNEUMONIA 10/29/2004    Patient's Medications  New Prescriptions   No medications on file  Previous Medications   ELVITEGRAVIR-COBICISTAT-EMTRICITABINE-TENOFOVIR (GENVOYA) 150-150-200-10 MG TABS TABLET    TAKE 1 TABLET BY MOUTH EVERY DAY WITH BREAKFAST   FISH OIL-OMEGA-3 FATTY ACIDS 1000 MG CAPSULE    Take 3 g by mouth daily.     LORATADINE (CLARITIN) 10 MG TABLET    Take 10 mg by mouth daily.  Modified Medications   No medications on file  Discontinued Medications   No medications on file    Subjective: Edwin [name] is in for his routine HIV follow-up visit.  He has had no problems obtaining, taking or tolerating his Genvoya.  He denies missing any doses.  He is hoping to find a new job that will give him the health insurance benefits.  He does not have a primary care provider currently.  He is working one full-time job and one part-time job.  He works 10 hours daily Monday through Friday.  He is not getting any regular exercise outside of work.  Review of Systems: Review of Systems  Constitutional: Negative for chills, diaphoresis, fever, malaise/fatigue and weight loss.  HENT: Negative for sore throat.   Respiratory: Negative for cough, sputum production and shortness of breath.   Cardiovascular: Negative for chest pain.  Gastrointestinal: Negative for abdominal pain, diarrhea, heartburn, nausea and vomiting.  Genitourinary: Negative for dysuria  and frequency.  Musculoskeletal: Negative for joint pain and myalgias.  Skin: Negative for rash.  Neurological: Negative for dizziness and headaches.  Psychiatric/Behavioral: Negative for depression and substance abuse. The patient has insomnia. The patient is not nervous/anxious.     Past Medical History:  Diagnosis Date  . HIV infection (HCC)   . Hyperlipidemia     Social History   Tobacco Use  . Smoking status: Former Smoker    Years: 12.00    Types: Cigarettes    Last attempt to quit: 07/31/2004    Years since quitting: 13.7  . Smokeless tobacco: Never Used  Substance Use Topics  . Alcohol use: Yes    Alcohol/week: 1.0 standard drinks    Types: 1 Standard drinks or equivalent per week    Comment: wine  . Drug use: No    Family History  Problem Relation Age of Onset  . Hypertension Father   . Cancer Mother     No Known Allergies  Health Maintenance  Topic Date Due  . COLONOSCOPY  07/30/2009  . INFLUENZA VACCINE  02/28/2018  . TETANUS/TDAP  10/26/2022  . Hepatitis C Screening  Completed  . HIV Screening  Completed    Objective:  Vitals:   04/30/18 1503  BP: (!) 159/78  Pulse: 76  Temp: 98 F (36.7 C)  Weight: 208 lb (94.3 kg)  Height: 5\' 10"  (1.778 m)   Body mass index is 29.84 kg/m.  Physical Exam  Constitutional: He is  oriented to person, place, and time.  He is in good spirits.  HENT:  Mouth/Throat: No oropharyngeal exudate.  Eyes: Conjunctivae are normal.  Cardiovascular: Normal rate, regular rhythm and normal heart sounds.  No murmur heard. Pulmonary/Chest: Effort normal and breath sounds normal.  Abdominal: Soft. He exhibits no mass. There is no tenderness.  Musculoskeletal: Normal range of motion.  Neurological: He is alert and oriented to person, place, and time.  Skin: No rash noted.  Psychiatric: He has a normal mood and affect.    Lab Results Lab Results  Component Value Date   WBC 5.3 09/11/2017   HGB 14.4 09/11/2017   HCT  41.0 09/11/2017   MCV 90.7 09/11/2017   PLT 196 09/11/2017    Lab Results  Component Value Date   CREATININE 1.09 09/11/2017   BUN 16 09/11/2017   NA 142 09/11/2017   K 4.6 09/11/2017   CL 104 09/11/2017   CO2 26 09/11/2017    Lab Results  Component Value Date   ALT 18 09/11/2017   AST 20 09/11/2017   ALKPHOS 96 10/12/2016   BILITOT 0.3 09/11/2017    Lab Results  Component Value Date   CHOL 259 (H) 09/11/2017   HDL 58 09/11/2017   LDLCALC 147 (H) 09/11/2017   TRIG 348 (H) 09/11/2017   CHOLHDL 4.5 09/11/2017   Lab Results  Component Value Date   LABRPR NON-REACTIVE 09/11/2017   HIV 1 RNA Quant (copies/mL)  Date Value  04/16/2018 <20 NOT DETECTED  09/11/2017 <20 DETECTED (A)  10/12/2016 <20 NOT DETECTED   CD4 T Cell Abs (/uL)  Date Value  04/16/2018 840  09/11/2017 950  10/12/2016 900     Problem List Items Addressed This Visit      High   Human immunodeficiency virus (HIV) disease (HCC)    His infection remains under excellent, long-term control.  He will continue Genvoya and follow-up after lab work in 6 months.  He received his Prevnar and influenza vaccines today.      Relevant Orders   T-helper cell (CD4)- ([facility] clinic only)   HIV-1 RNA quant-no reflex-bld   CBC   Comprehensive metabolic panel   Lipid panel   RPR     Medium   Obesity    I talked to him again about lifestyle modification.  She will get regular exercise PCP as soon as he has health insurance coverage.        Dyslipidemia    I talked to him again about lifestyle modification.  She will get regular exercise PCP as soon as he has health insurance coverage.             Cliffton Asters, MD [facility] Infectious Disease [facility] [phone] pager   [phone] cell 04/30/2018, 3:21 PM

## 2018-04-30 NOTE — Assessment & Plan Note (Signed)
I talked to him again about lifestyle modification.  She will get regular exercise PCP as soon as he has health insurance coverage.

## 2018-04-30 NOTE — Addendum Note (Signed)
Addended by: Valarie Cones on: 04/30/2018 03:28 PM   Modules accepted: Orders

## 2018-04-30 NOTE — Assessment & Plan Note (Signed)
His infection remains under excellent, long-term control.  He will continue Genvoya and follow-up after lab work in 6 months.  He received his Prevnar and influenza vaccines today.

## 2018-05-22 ENCOUNTER — Other Ambulatory Visit: Payer: Self-pay | Admitting: Internal Medicine

## 2018-05-22 DIAGNOSIS — B20 Human immunodeficiency virus [HIV] disease: Secondary | ICD-10-CM

## 2018-09-12 ENCOUNTER — Encounter: Payer: Self-pay | Admitting: Internal Medicine

## 2018-10-16 ENCOUNTER — Other Ambulatory Visit: Payer: Medicaid Other

## 2018-10-24 NOTE — Progress Notes (Deleted)
Virtual Visit via Telephone Note  I connected with Edwin [name] on 10/24/18 at  1:45 PM EDT by telephone and verified that I am speaking with the correct person using two identifiers.   I discussed the limitations, risks, security and privacy concerns of performing an evaluation and management service by telephone and the availability of in person appointments. I also discussed with the patient that there may be a patient responsible charge related to this service. The patient expressed understanding and agreed to proceed.   History of Present Illness: Genvoya   Observations/Objective:  HIV 1 RNA Quant (copies/mL)  Date Value  04/16/2018 <20 NOT DETECTED  09/11/2017 <20 DETECTED (A)  10/12/2016 <20 NOT DETECTED   CD4 T Cell Abs (/uL)  Date Value  04/16/2018 840  09/11/2017 950  10/12/2016 900   Assessment and Plan:   Follow Up Instructions:    I discussed the assessment and treatment plan with the patient. The patient was provided an opportunity to ask questions and all were answered. The patient agreed with the plan and demonstrated an understanding of the instructions.   The patient was advised to call back or seek an in-person evaluation if the symptoms worsen or if the condition fails to improve as anticipated.  I provided *** minutes of non-face-to-face time during this encounter.   Cliffton Asters, MD

## 2018-10-30 ENCOUNTER — Ambulatory Visit: Payer: Medicaid Other | Admitting: Internal Medicine

## 2018-11-04 ENCOUNTER — Telehealth: Payer: Self-pay | Admitting: Internal Medicine

## 2018-11-04 NOTE — Telephone Encounter (Signed)
COVID-19 Pre-Screening Questions: 11/04/18 ° °Do you currently have a fever (>100 °F), chills or unexplained body aches? NO ° °Are you currently experiencing new cough, shortness of breath, sore throat, runny nose? NO °•  °Have you recently travelled outside the state of [place] in the last 14 days? NO °•  °Have you been in contact with someone that is currently pending confirmation of Covid19 testing or has been confirmed to have the Covid19 virus? NO  ° °**If the patient answers NO to ALL questions -  advise the patient to please call the clinic before coming to the office should any symptoms develop.  ° ° °

## 2018-11-05 ENCOUNTER — Other Ambulatory Visit: Payer: Self-pay

## 2018-11-05 ENCOUNTER — Other Ambulatory Visit: Payer: Medicaid Other

## 2018-11-05 DIAGNOSIS — B20 Human immunodeficiency virus [HIV] disease: Secondary | ICD-10-CM

## 2018-11-06 LAB — T-HELPER CELL (CD4) - (RCID CLINIC ONLY)
CD4 % Helper T Cell: 53 % (ref 33–55)
CD4 T Cell Abs: 880 /uL (ref 400–2700)

## 2018-11-09 LAB — CBC
HCT: 40.2 % (ref 38.5–50.0)
Hemoglobin: 14.3 g/dL (ref 13.2–17.1)
MCH: 32.8 pg (ref 27.0–33.0)
MCHC: 35.6 g/dL (ref 32.0–36.0)
MCV: 92.2 fL (ref 80.0–100.0)
MPV: 9.9 fL (ref 7.5–12.5)
Platelets: 190 10*3/uL (ref 140–400)
RBC: 4.36 10*6/uL (ref 4.20–5.80)
RDW: 14 % (ref 11.0–15.0)
WBC: 4.5 10*3/uL (ref 3.8–10.8)

## 2018-11-09 LAB — RPR: RPR Ser Ql: NONREACTIVE

## 2018-11-09 LAB — COMPREHENSIVE METABOLIC PANEL
AG Ratio: 1.7 (calc) (ref 1.0–2.5)
ALT: 18 U/L (ref 9–46)
AST: 21 U/L (ref 10–35)
Albumin: 4.5 g/dL (ref 3.6–5.1)
Alkaline phosphatase (APISO): 116 U/L (ref 35–144)
BUN: 14 mg/dL (ref 7–25)
CO2: 26 mmol/L (ref 20–32)
Calcium: 9.6 mg/dL (ref 8.6–10.3)
Chloride: 109 mmol/L (ref 98–110)
Creat: 1.13 mg/dL (ref 0.70–1.33)
Globulin: 2.6 g/dL (calc) (ref 1.9–3.7)
Glucose, Bld: 92 mg/dL (ref 65–99)
Potassium: 4.4 mmol/L (ref 3.5–5.3)
Sodium: 142 mmol/L (ref 135–146)
Total Bilirubin: 0.3 mg/dL (ref 0.2–1.2)
Total Protein: 7.1 g/dL (ref 6.1–8.1)

## 2018-11-09 LAB — LIPID PANEL
Cholesterol: 272 mg/dL — ABNORMAL HIGH (ref <200)
HDL: 56 mg/dL (ref >=40)
LDL Cholesterol (Calc): 168 mg/dL (calc) — ABNORMAL HIGH
Non-HDL Cholesterol (Calc): 216 mg/dL (calc) — ABNORMAL HIGH (ref <130)
Total CHOL/HDL Ratio: 4.9 (calc) (ref <5.0)
Triglycerides: 292 mg/dL — ABNORMAL HIGH (ref <150)

## 2018-11-09 LAB — HIV-1 RNA QUANT-NO REFLEX-BLD
HIV 1 RNA Quant: <20 copies/mL — AB
HIV-1 RNA Quant, Log: <1.3 Log copies/mL — AB

## 2018-11-22 ENCOUNTER — Other Ambulatory Visit: Payer: Self-pay | Admitting: Internal Medicine

## 2018-11-22 DIAGNOSIS — B20 Human immunodeficiency virus [HIV] disease: Secondary | ICD-10-CM

## 2018-11-25 ENCOUNTER — Encounter: Payer: Self-pay | Admitting: Internal Medicine

## 2018-11-25 ENCOUNTER — Ambulatory Visit (INDEPENDENT_AMBULATORY_CARE_PROVIDER_SITE_OTHER): Payer: Medicaid Other | Admitting: Internal Medicine

## 2018-11-25 ENCOUNTER — Other Ambulatory Visit: Payer: Self-pay

## 2018-11-25 VITALS — BP 158/73 | HR 86 | Temp 98.0°F | Ht 68.0 in | Wt 211.0 lb

## 2018-11-25 DIAGNOSIS — Z0000 Encounter for general adult medical examination without abnormal findings: Secondary | ICD-10-CM

## 2018-11-25 DIAGNOSIS — B20 Human immunodeficiency virus [HIV] disease: Secondary | ICD-10-CM

## 2018-11-25 NOTE — Assessment & Plan Note (Signed)
His infection remains under excellent, long-term control.  He will continue Genvoya and follow-up after lab work in 6 months. 

## 2018-11-25 NOTE — Progress Notes (Signed)
Patient Active Problem List   Diagnosis Date Noted  . Human immunodeficiency virus (HIV) disease (HCC) 08/10/2006    Priority: High  . Hyperglycemia 08/03/2014    Priority: Medium  . Obesity 11/23/2008    Priority: Medium  . Dyslipidemia 08/10/2006    Priority: Medium  . Hordeolum 05/20/2013  . Hearing loss in right ear 01/03/2012  . Seasonal allergies 01/03/2011  . GYNECOMASTIA 08/29/2006  . HEPATITIS B 08/10/2006  . VENEREAL WART 08/10/2006  . DENTAL CARIES 08/10/2006  . TOBACCO USE, QUIT 08/10/2006  . PNEUMOCYSTIS PNEUMONIA 10/29/2004    Patient's Medications  New Prescriptions   No medications on file  Previous Medications   FISH OIL-OMEGA-3 FATTY ACIDS 1000 MG CAPSULE    Take 3 g by mouth daily.     GENVOYA 150-150-200-10 MG TABS TABLET    TAKE 1 TABLET BY MOUTH EVERY DAY WITH BREAKFAST   LORATADINE (CLARITIN) 10 MG TABLET    Take 10 mg by mouth daily.  Modified Medications   No medications on file  Discontinued Medications   No medications on file    Subjective: Edwin [name] is in for his routine HIV follow-up visit.  He has had no problems obtaining, taking or tolerating Genvoya and never misses doses.  He is feeling well.  Unfortunately he no longer has a PCP.  Review of Systems: Review of Systems  Constitutional: Negative for fever, malaise/fatigue and weight loss.  Gastrointestinal: Negative for abdominal pain, diarrhea, nausea and vomiting.    Past Medical History:  Diagnosis Date  . HIV infection (HCC)   . Hyperlipidemia     Social History   Tobacco Use  . Smoking status: Former Smoker    Years: 12.00    Types: Cigarettes    Last attempt to quit: 07/31/2004    Years since quitting: 14.3  . Smokeless tobacco: Never Used  Substance Use Topics  . Alcohol use: Yes    Alcohol/week: 1.0 standard drinks    Types: 1 Standard drinks or equivalent per week    Comment: wine  . Drug use: No    Family History  Problem Relation Age of Onset   . Hypertension Father   . Cancer Mother     No Known Allergies  Health Maintenance  Topic Date Due  . COLONOSCOPY  07/30/2009  . INFLUENZA VACCINE  03/01/2019  . TETANUS/TDAP  10/26/2022  . Hepatitis C Screening  Completed  . HIV Screening  Completed    Objective:  Vitals:   11/25/18 1609  BP: (!) 158/73  Pulse: 86  Temp: 98 F (36.7 C)  TempSrc: Oral  SpO2: 98%  Weight: 211 lb (95.7 kg)  Height: 5\' 8"  (1.727 m)   Body mass index is 32.08 kg/m.  Physical Exam Constitutional:      Comments: He is in good spirits as usual.  HENT:     Mouth/Throat:     Pharynx: No oropharyngeal exudate.  Cardiovascular:     Rate and Rhythm: Normal rate and regular rhythm.     Heart sounds: No murmur.  Pulmonary:     Effort: Pulmonary effort is normal.     Breath sounds: Normal breath sounds.  Abdominal:     Palpations: Abdomen is soft.     Tenderness: There is no abdominal tenderness.  Psychiatric:        Mood and Affect: Mood normal.     Lab Results Lab Results  Component Value Date   WBC  4.5 11/05/2018   HGB 14.3 11/05/2018   HCT 40.2 11/05/2018   MCV 92.2 11/05/2018   PLT 190 11/05/2018    Lab Results  Component Value Date   CREATININE 1.13 11/05/2018   BUN 14 11/05/2018   NA 142 11/05/2018   K 4.4 11/05/2018   CL 109 11/05/2018   CO2 26 11/05/2018    Lab Results  Component Value Date   ALT 18 11/05/2018   AST 21 11/05/2018   ALKPHOS 96 10/12/2016   BILITOT 0.3 11/05/2018    Lab Results  Component Value Date   CHOL 272 (H) 11/05/2018   HDL 56 11/05/2018   LDLCALC 168 (H) 11/05/2018   TRIG 292 (H) 11/05/2018   CHOLHDL 4.9 11/05/2018   Lab Results  Component Value Date   LABRPR NON-REACTIVE 11/05/2018   HIV 1 RNA Quant (copies/mL)  Date Value  11/05/2018 <20 DETECTED (A)  04/16/2018 <20 NOT DETECTED  09/11/2017 <20 DETECTED (A)   CD4 T Cell Abs (/uL)  Date Value  11/05/2018 880  04/16/2018 840  09/11/2017 950     Problem List Items  Addressed This Visit      High   Human immunodeficiency virus (HIV) disease (HCC)    His infection remains under excellent, long-term control.  He will continue Genvoya and follow-up after lab work in 6 months.      Relevant Orders   T-helper cell (CD4)- ([facility] only)   HIV-1 RNA quant-no reflex-bld    Other Visit Diagnoses    Routine adult health maintenance    -  Primary   Relevant Orders   Ambulatory referral to Internal Medicine        Cliffton Asters, MD [facility] [phone] pager   [phone] cell 11/25/2018, 4:25 PM

## 2019-02-10 ENCOUNTER — Ambulatory Visit: Payer: Self-pay | Attending: Family Medicine | Admitting: Family Medicine

## 2019-02-10 ENCOUNTER — Encounter: Payer: Self-pay | Admitting: Family Medicine

## 2019-02-10 ENCOUNTER — Other Ambulatory Visit: Payer: Self-pay

## 2019-02-10 ENCOUNTER — Ambulatory Visit: Payer: Medicaid Other | Admitting: Family Medicine

## 2019-02-10 VITALS — BP 153/74 | HR 65 | Temp 98.1°F | Ht 68.0 in | Wt 212.0 lb

## 2019-02-10 DIAGNOSIS — E782 Mixed hyperlipidemia: Secondary | ICD-10-CM

## 2019-02-10 DIAGNOSIS — B20 Human immunodeficiency virus [HIV] disease: Secondary | ICD-10-CM

## 2019-02-10 DIAGNOSIS — E66811 Obesity, class 1: Secondary | ICD-10-CM

## 2019-02-10 DIAGNOSIS — E669 Obesity, unspecified: Secondary | ICD-10-CM

## 2019-02-10 DIAGNOSIS — I10 Essential (primary) hypertension: Secondary | ICD-10-CM

## 2019-02-10 MED ORDER — AMLODIPINE BESYLATE 5 MG PO TABS: 5.0000 mg | ORAL_TABLET | Freq: Every day | ORAL | 3 refills | Status: DC

## 2019-02-10 NOTE — Progress Notes (Signed)
[facility];

## 2019-02-10 NOTE — Patient Instructions (Signed)
Hypertension, Adult Hypertension is another name for high blood pressure. High blood pressure forces your heart to work harder to pump blood. This can cause problems over time. There are two numbers in a blood pressure reading. There is a top number (systolic) over a bottom number (diastolic). It is best to have a blood pressure that is below 120/80. Healthy choices can help lower your blood pressure, or you may need medicine to help lower it. What are the causes? The cause of this condition is not known. Some conditions may be related to high blood pressure. What increases the risk?  Smoking.  Having type 2 diabetes mellitus, high cholesterol, or both.  Not getting enough exercise or physical activity.  Being overweight.  Having too much fat, sugar, calories, or salt (sodium) in your diet.  Drinking too much alcohol.  Having long-term (chronic) kidney disease.  Having a family history of high blood pressure.  Age. Risk increases with age.  Race. You may be at higher risk if you are African American.  Gender. Men are at higher risk than women before age 45. After age 65, women are at higher risk than men.  Having obstructive sleep apnea.  Stress. What are the signs or symptoms?  High blood pressure may not cause symptoms. Very high blood pressure (hypertensive crisis) may cause: ? Headache. ? Feelings of worry or nervousness (anxiety). ? Shortness of breath. ? Nosebleed. ? A feeling of being sick to your stomach (nausea). ? Throwing up (vomiting). ? Changes in how you see. ? Very bad chest pain. ? Seizures. How is this treated?  This condition is treated by making healthy lifestyle changes, such as: ? Eating healthy foods. ? Exercising more. ? Drinking less alcohol.  Your health care provider may prescribe medicine if lifestyle changes are not enough to get your blood pressure under control, and if: ? Your top number is above 130. ? Your bottom number is above 80.   Your personal target blood pressure may vary. Follow these instructions at home: Eating and drinking   If told, follow the DASH eating plan. To follow this plan: ? Fill one half of your plate at each meal with fruits and vegetables. ? Fill one fourth of your plate at each meal with whole grains. Whole grains include whole-wheat pasta, brown rice, and whole-grain bread. ? Eat or drink low-fat dairy products, such as skim milk or low-fat yogurt. ? Fill one fourth of your plate at each meal with low-fat (lean) proteins. Low-fat proteins include fish, chicken without skin, eggs, beans, and tofu. ? Avoid fatty meat, cured and processed meat, or chicken with skin. ? Avoid pre-made or processed food.  Eat less than 1,500 mg of salt each day.  Do not drink alcohol if: ? Your doctor tells you not to drink. ? You are pregnant, may be pregnant, or are planning to become pregnant.  If you drink alcohol: ? Limit how much you use to:  0-1 drink a day for women.  0-2 drinks a day for men. ? Be aware of how much alcohol is in your drink. In the [place] one drink equals one 12 oz bottle of beer (355 mL), one 5 oz glass of wine (148 mL), or one 1 oz glass of hard liquor (44 mL). Lifestyle   Work with your doctor to stay at a healthy weight or to lose weight. Ask your doctor what the best weight is for you.  Get at least 30 minutes of exercise most   days of the week. This may include walking, swimming, or biking.  Get at least 30 minutes of exercise that strengthens your muscles (resistance exercise) at least 3 days a week. This may include lifting weights or doing Pilates.  Do not use any products that contain nicotine or tobacco, such as cigarettes, e-cigarettes, and chewing tobacco. If you need help quitting, ask your doctor.  Check your blood pressure at home as told by your doctor.  Keep all follow-up visits as told by your doctor. This is important. Medicines  Take over-the-counter and  prescription medicines only as told by your doctor. Follow directions carefully.  Do not skip doses of blood pressure medicine. The medicine does not work as well if you skip doses. Skipping doses also puts you at risk for problems.  Ask your doctor about side effects or reactions to medicines that you should watch for. Contact a doctor if you:  Think you are having a reaction to the medicine you are taking.  Have headaches that keep coming back (recurring).  Feel dizzy.  Have swelling in your ankles.  Have trouble with your vision. Get help right away if you:  Get a very bad headache.  Start to feel mixed up (confused).  Feel weak or numb.  Feel faint.  Have very bad pain in your: ? Chest. ? Belly (abdomen).  Throw up more than once.  Have trouble breathing. Summary  Hypertension is another name for high blood pressure.  High blood pressure forces your heart to work harder to pump blood.  For most people, a normal blood pressure is less than 120/80.  Making healthy choices can help lower blood pressure. If your blood pressure does not get lower with healthy choices, you may need to take medicine. This information is not intended to replace advice given to you by your health care provider. Make sure you discuss any questions you have with your health care provider. Document Released: 01/03/2008 Document Revised: 03/27/2018 Document Reviewed: 03/27/2018 Elsevier Patient Education  2020 Elsevier Inc.  Preventing High Cholesterol Cholesterol is a white, waxy substance similar to fat that the human body needs to help build cells. The liver makes all the cholesterol that a person's body needs. Having high cholesterol (hypercholesterolemia) increases a person's risk for heart disease and stroke. Extra (excess) cholesterol comes from the food the person eats. High cholesterol can often be prevented with diet and lifestyle changes. If you already have high cholesterol, you  can control it with diet and lifestyle changes and with medicine. How can high cholesterol affect me? If you have high cholesterol, deposits (plaques) may build up on the walls of your arteries. The arteries are the blood vessels that carry blood away from your heart. Plaques make the arteries narrower and stiffer. This can limit or block blood flow and cause blood clots to form. Blood clots:  Are tiny balls of cells that form in your blood.  Can move to the heart or brain, causing a heart attack or stroke. Plaques in arteries greatly increase your risk for heart attack and stroke.Making diet and lifestyle changes can reduce your risk for these conditions that may threaten your life. What can increase my risk? This condition is more likely to develop in people who:  Eat foods that are high in saturated fat or cholesterol. Saturated fat is mostly found in: ? Foods that contain animal fat, such as red meat and some dairy products. ? Certain fatty foods made from plants, such as tropical   oils.  Are overweight.  Are not getting enough exercise.  Have a family history of high cholesterol. What actions can I take to prevent this? Nutrition   Eat less saturated fat.  Avoid trans fats (partially hydrogenated oils). These are often found in margarine and in some baked goods, fried foods, and snacks bought in packages.  Avoid precooked or cured meat, such as sausages or meat loaves.  Avoid foods and drinks that have added sugars.  Eat more fruits, vegetables, and whole grains.  Choose healthy sources of protein, such as fish, poultry, lean cuts of red meat, beans, peas, lentils, and nuts.  Choose healthy sources of fat, such as: ? Nuts. ? Vegetable oils, especially olive oil. ? Fish that have healthy fats (omega-3 fatty acids), such as mackerel or salmon. The items listed above may not be a complete list of recommended foods and beverages. Contact a dietitian for more information.  Lifestyle  Lose weight if you are overweight. Losing 5-10 lb (2.3-4.5 kg) can help prevent or control high cholesterol. It can also lower your risk for diabetes and high blood pressure. Ask your health care provider to help you with a diet and exercise plan to lose weight safely.  Do not use any products that contain nicotine or tobacco, such as cigarettes, e-cigarettes, and chewing tobacco. If you need help quitting, ask your health care provider.  Limit your alcohol intake. ? Do not drink alcohol if:  Your health care provider tells you not to drink.  You are pregnant, may be pregnant, or are planning to become pregnant. ? If you drink alcohol:  Limit how much you use to:  0-1 drink a day for women.  0-2 drinks a day for men.  Be aware of how much alcohol is in your drink. In the [place] one drink equals one 12 oz bottle of beer (355 mL), one 5 oz glass of wine (148 mL), or one 1 oz glass of hard liquor (44 mL). Activity   Get enough exercise. Each week, do at least 150 minutes of exercise that takes a medium level of effort (moderate-intensity exercise). ? This is exercise that:  Makes your heart beat faster and makes you breathe harder than usual.  Allows you to still be able to talk. ? You could exercise in short sessions several times a day or longer sessions a few times a week. For example, on 5 days each week, you could walk fast or ride your bike 3 times a day for 10 minutes each time.  Do exercises as told by your health care provider. Medicines  In addition to diet and lifestyle changes, your health care provider may recommend medicines to help lower cholesterol. This may be a medicine to lower the amount of cholesterol your liver makes. You may need medicine if: ? Diet and lifestyle changes do not lower your cholesterol enough. ? You have high cholesterol and other risk factors for heart disease or stroke.  Take over-the-counter and prescription medicines only as  told by your health care provider. General information  Manage your risk factors for high cholesterol. Talk with your health care provider about all your risk factors and how to lower your risk.  Manage other conditions that you have, such as diabetes or high blood pressure (hypertension).  Have blood tests to check your cholesterol levels at regular points in time as told by your health care provider.  Keep all follow-up visits as told by your health care provider. This is   important. Where to find more information  American Heart Association: www.heart.org  National Heart, Lung, and Blood Institute: www.nhlbi.nih.gov Summary  High cholesterol increases your risk for heart disease and stroke. By keeping your cholesterol level low, you can reduce your risk for these conditions.  High cholesterol can often be prevented with diet and lifestyle changes.  Work with your health care provider to manage your risk factors, and have your blood tested regularly. This information is not intended to replace advice given to you by your health care provider. Make sure you discuss any questions you have with your health care provider. Document Released: 08/01/2015 Document Revised: 11/08/2018 Document Reviewed: 03/25/2016 Elsevier Patient Education  2020 Elsevier Inc.  

## 2019-02-10 NOTE — Progress Notes (Signed)
New Patient Office Visit  Subjective:  Patient ID: Edwin [name], male    DOB: Aug 19, 1960  Age: 58 y.o. MRN: [id number]  CC: Elevated blood pressure, needs a primary care physician  HPI Edwin [name] presents to establish primary care.  He reports that his blood pressure has been elevated when he has seen his infectious disease doctor in follow-up of HIV which has been well controlled and at an undetectable level.  He also reports a history of hyperlipidemia.  He reports that his infectious disease doctor wanted him to establish with a primary care physician in follow-up of his hypertension and hyperlipidemia.  He has been told in the past that his blood pressure was elevated but he is tried to avoid being on medication for his high blood pressure.  He states that overall he feels well.  He did have an annual physical exam with his ID doctor back in April and had blood work done at that time.  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  He does not wish to be on medication to help lower his cholesterol at this time as he would like to try changes in diet and an increase in exercise.  He did smoke in the past but no longer smokes.  He has occasionally had dull, frontal or top of the head headaches which are infrequent and he believes that these may be associated with his blood pressure being high.  He has had no episodes of focal numbness or weakness.  No syncopal episodes.  No chest pain or palpitations.  No shortness of breath or cough.  He denies any abdominal pain-no nausea/vomiting/diarrhea or constipation.  He believes that his colonoscopy is currently up-to-date.  He has had no blood in the stool and no dark stools.  No peripheral edema.  No chronic muscle or joint pain.  Past Medical History:  Diagnosis Date  . HIV infection (HCC)   . Hyperlipidemia     Past Surgical History:  Procedure Laterality Date  . COLONOSCOPY  06/2004   RMR:  Anal canal/internal hemorrhoids, otherwise normal colon    Family History  Problem Relation Age of Onset  . Hypertension Father   . Cancer Mother     Social History   Tobacco Use  . Smoking status: Former Smoker    Years: 12.00    Types: Cigarettes    Quit date: 07/31/2004    Years since quitting: 14.5  . Smokeless tobacco: Never Used  Substance Use Topics  . Alcohol use: Yes    Alcohol/week: 1.0 standard drinks    Types: 1 Standard drinks or equivalent per week    Comment: wine  . Drug use: No    ROS Review of Systems  Constitutional: Negative for chills, fatigue and fever.  HENT: Negative for sore throat and trouble swallowing.   Eyes: Negative for photophobia and visual disturbance.  Respiratory: Negative for cough and shortness of breath.   Cardiovascular: Negative for chest pain, palpitations and leg swelling.  Gastrointestinal: Negative for abdominal pain, constipation, diarrhea and nausea.  Endocrine: Negative for polydipsia, polyphagia and polyuria.  Genitourinary: Negative for dysuria and frequency.  Musculoskeletal: Negative for arthralgias and back pain.  Neurological: Positive for headaches (Occasional, mild). Negative for dizziness, weakness and numbness.  Hematological: Negative for adenopathy. Does not bruise/bleed easily.  Psychiatric/Behavioral: Negative for self-injury and suicidal ideas.    Objective:   Today's Vitals: BP (!) 153/74 (BP Location: Left Arm, Patient  Position: Sitting, Cuff Size: Large)   Pulse 65   Temp 98.1 F (36.7 C) (Oral)   Ht 5\' 8"  (1.727 m)   Wt 212 lb (96.2 kg)   SpO2 95%   BMI 32.23 kg/m   Physical Exam Vitals signs and nursing note reviewed.  Constitutional:      General: He is not in acute distress.    Appearance: He is obese. He is not ill-appearing.  Neck:     Musculoskeletal: Normal range of motion and neck supple. No muscular tenderness.     Vascular: No carotid bruit.  Cardiovascular:     Rate and Rhythm:  Normal rate and regular rhythm.  Pulmonary:     Effort: Pulmonary effort is normal.     Breath sounds: Normal breath sounds.  Abdominal:     Palpations: Abdomen is soft.     Tenderness: There is no abdominal tenderness. There is no right CVA tenderness, left CVA tenderness, guarding or rebound.  Musculoskeletal:        General: No tenderness or deformity.     Right lower leg: No edema.     Left lower leg: No edema.  Lymphadenopathy:     Cervical: No cervical adenopathy.  Neurological:     General: No focal deficit present.     Mental Status: He is alert and oriented to person, place, and time.  Psychiatric:        Mood and Affect: Mood normal.        Behavior: Behavior normal.     Assessment & Plan:  1. Essential hypertension Discussed with patient that his blood pressure has been elevated when he was seen by his infectious disease doctor as well as at today's visit which would establish the diagnosis of hypertension.  Discussed with patient that long-term issues such as chronic kidney disease, heart failure and stroke can be caused by untreated blood pressure.  Patient agrees to start the use of amlodipine 5 mg once daily to help lower his blood pressure.  He was also given information on hypertension and DASH diet as part of his after visit summary.  Patient will return to clinic for nurse visit blood pressure check in 4 weeks and if his blood pressure is controlled at that time then next follow-up will be approximately 4 months.  He should call or return sooner if he cannot tolerate the medication, feels that he is having continued hypertension or any concerns. - amLODipine (NORVASC) 5 MG tablet; Take 1 tablet (5 mg total) by mouth daily. At bedtime to lower cholesterol  Dispense: 30 tablet; Refill: 3  2. Mixed hyperlipidemia; obesity BMI 32 Patient had lipid panel done 11/05/2018 by his infectious disease doctor as part of his well exam.  Patient had total cholesterol of 272,  triglycerides of 292 and total cholesterol of 168.  Discussed with the patient that this could increase his risk of peripheral vascular disease including increased risk of heart attack and stroke.  Patient does not wish to take medication to lower his cholesterol at this time but prefers to try a low-fat diet and an increase in exercise along with weight loss.  Discussed the importance of controlling lipids with the patient and patient agrees to have repeat lipid panel in 4 months and if his lipids are still abnormal at that time he would be more willing to start medication to help control his hyperlipidemia.  In the interim, he will make attempts to lose weight and lower his lipids through dietary  changes and exercise.  3.  HIV disease Patient on review of chart has regular follow-up with his infectious disease doctor and is doing well on medications.  Outpatient Encounter Medications as of 02/10/2019  Medication Sig  . fish oil-omega-3 fatty acids 1000 MG capsule Take 3 g by mouth daily.    . GENVOYA 150-150-200-10 MG TABS tablet TAKE 1 TABLET BY MOUTH EVERY DAY WITH BREAKFAST  . loratadine (CLARITIN) 10 MG tablet Take 10 mg by mouth daily.   No facility-administered encounter medications on file as of 02/10/2019.    An After Visit Summary was printed and given to the patient.  Follow-up: Return in about 4 months (around 06/13/2019) for Nurse visit for BP check in 4 weeks;.  Antony Blackbird, MD

## 2019-03-10 ENCOUNTER — Ambulatory Visit: Payer: Medicaid Other | Admitting: Pharmacist

## 2019-04-03 ENCOUNTER — Encounter: Payer: Self-pay | Admitting: Internal Medicine

## 2019-05-19 ENCOUNTER — Telehealth: Payer: Self-pay

## 2019-05-19 ENCOUNTER — Other Ambulatory Visit: Payer: Self-pay | Admitting: Internal Medicine

## 2019-05-19 DIAGNOSIS — B20 Human immunodeficiency virus [HIV] disease: Secondary | ICD-10-CM

## 2019-05-19 NOTE — Telephone Encounter (Signed)
Attempted to contact patient to schedule follow up appointments

## 2019-06-16 ENCOUNTER — Ambulatory Visit: Payer: Medicaid Other | Admitting: Family Medicine

## 2019-09-03 ENCOUNTER — Other Ambulatory Visit: Payer: Medicaid Other

## 2019-09-03 ENCOUNTER — Ambulatory Visit: Payer: Medicaid Other

## 2019-09-08 ENCOUNTER — Other Ambulatory Visit: Payer: Medicaid Other

## 2019-09-08 ENCOUNTER — Ambulatory Visit: Payer: Medicaid Other

## 2019-09-11 ENCOUNTER — Ambulatory Visit: Payer: Medicaid Other

## 2019-09-11 ENCOUNTER — Other Ambulatory Visit: Payer: Medicaid Other

## 2019-09-11 ENCOUNTER — Other Ambulatory Visit: Payer: Self-pay

## 2019-09-11 DIAGNOSIS — B20 Human immunodeficiency virus [HIV] disease: Secondary | ICD-10-CM

## 2019-09-12 ENCOUNTER — Encounter: Payer: Self-pay | Admitting: Internal Medicine

## 2019-09-12 LAB — T-HELPER CELL (CD4) - (RCID CLINIC ONLY)
CD4 % Helper T Cell: 51 % (ref 33–65)
CD4 T Cell Abs: 833 /uL (ref 400–1790)

## 2019-09-15 ENCOUNTER — Other Ambulatory Visit: Payer: Self-pay

## 2019-09-15 DIAGNOSIS — B20 Human immunodeficiency virus [HIV] disease: Secondary | ICD-10-CM

## 2019-09-15 LAB — HIV-1 RNA QUANT-NO REFLEX-BLD
HIV 1 RNA Quant: <20 copies/mL
HIV-1 RNA Quant, Log: <1.3 Log copies/mL

## 2019-09-15 MED ORDER — GENVOYA 150-150-200-10 MG PO TABS: ORAL_TABLET | ORAL | 0 refills | Status: DC

## 2019-09-18 ENCOUNTER — Encounter: Payer: Medicaid Other | Admitting: Internal Medicine

## 2019-09-29 ENCOUNTER — Telehealth (INDEPENDENT_AMBULATORY_CARE_PROVIDER_SITE_OTHER): Payer: Self-pay | Admitting: Internal Medicine

## 2019-09-29 ENCOUNTER — Encounter: Payer: Self-pay | Admitting: Internal Medicine

## 2019-09-29 ENCOUNTER — Other Ambulatory Visit: Payer: Self-pay

## 2019-09-29 DIAGNOSIS — B20 Human immunodeficiency virus [HIV] disease: Secondary | ICD-10-CM

## 2019-09-29 NOTE — Progress Notes (Signed)
Virtual Visit via Video Note  I connected with@ on 09/29/19 at  2:30 PM EST by a video enabled telemedicine application and verified that I am speaking with the correct person using two identifiers.  Location: Patient: [employer] residence Provider: [facility]   I discussed the limitations of evaluation and management by telemedicine and the availability of in person appointments. The patient expressed understanding and agreed to proceed.  [facility]  Patient Active Problem List   Diagnosis Date Noted  . Human immunodeficiency virus (HIV) disease (HCC) 08/10/2006    Priority: High  . Hyperglycemia 08/03/2014    Priority: Medium  . Obesity 11/23/2008    Priority: Medium  . Dyslipidemia 08/10/2006    Priority: Medium  . Hordeolum 05/20/2013  . Hearing loss in right ear 01/03/2012  . Seasonal allergies 01/03/2011  . GYNECOMASTIA 08/29/2006  . HEPATITIS B 08/10/2006  . VENEREAL WART 08/10/2006  . DENTAL CARIES 08/10/2006  . TOBACCO USE, QUIT 08/10/2006  . PNEUMOCYSTIS PNEUMONIA 10/29/2004    Patient's Medications  New Prescriptions   No medications on file  Previous Medications   AMLODIPINE (NORVASC) 5 MG TABLET    Take 1 tablet (5 mg total) by mouth daily. At bedtime to lower cholesterol   ELVITEGRAVIR-COBICISTAT-EMTRICITABINE-TENOFOVIR (GENVOYA) 150-150-200-10 MG TABS TABLET    TAKE 1 TABLET BY MOUTH EVERY DAY WITH BREAKFAST   FISH OIL-OMEGA-3 FATTY ACIDS 1000 MG CAPSULE    Take 3 g by mouth daily.     LORATADINE (CLARITIN) 10 MG TABLET    Take 10 mg by mouth daily.  Modified Medications   No medications on file  Discontinued Medications   No medications on file    History of Present Illness: I called and spoke with Derwood today.  He has not had any problems obtaining, taking or tolerating his Genvoya.  He has changed his breakfast routine and does not always eat breakfast so he now takes Genvoya at lunchtime with a meal.  He does not recall  missing any doses.  He has not on any new medications.  He is feeling well.  He continues to work [employer] buildings.  He says that everyone wears a mask and he feels safe during the pandemic.  He wants to know if he should take the Covid vaccine when he becomes eligible.   Observations/Objective: Review of Systems  Constitutional: Negative for fever and weight loss.  Respiratory: Negative for cough and shortness of breath.   Cardiovascular: Negative for chest pain.  Gastrointestinal: Negative for abdominal pain, diarrhea, nausea and vomiting.    Past Medical History:  Diagnosis Date  . HIV infection (HCC)   . Hyperlipidemia     Social History   Tobacco Use  . Smoking status: Former Smoker    Years: 12.00    Types: Cigarettes    Quit date: 07/31/2004    Years since quitting: 15.1  . Smokeless tobacco: Never Used  Substance Use Topics  . Alcohol use: Yes    Alcohol/week: 1.0 standard drinks    Types: 1 Standard drinks or equivalent per week    Comment: wine  . Drug use: No    Family History  Problem Relation Age of Onset  . Hypertension Father   . Cancer Mother     No Known Allergies  Health Maintenance  Topic Date Due  . COLONOSCOPY  07/30/2009  . INFLUENZA VACCINE  03/01/2019  . TETANUS/TDAP  10/26/2022  . Hepatitis C Screening  Completed  . HIV  Screening  Completed    Assessment and Plan: His infection remains under excellent long-term control.  I told him that he has the option of switching to [facility] if it would be easier to take his medication without food.  He will stay on [place] for now and follow-up after lab work in 1 year.  I did recommend that he take the Covid vaccine once he is eligible.  Follow Up Instructions: 1. Continue Genvoya 2. Follow-up after lab work in 1 year   I discussed the assessment and treatment plan with the patient. The patient was provided an opportunity to ask questions and all were answered. The patient agreed with  the plan and demonstrated an understanding of the instructions.   The patient was advised to call back or seek an in-person evaluation if the symptoms worsen or if the condition fails to improve as anticipated.  I provided 15 minutes of non-face-to-face time during this encounter.  Michel Bickers, MD [facility] for Infectious [facility] [phone] pager   [phone] cell 09/29/2019, 2:02 PM

## 2019-10-08 ENCOUNTER — Other Ambulatory Visit: Payer: Self-pay | Admitting: Internal Medicine

## 2019-10-08 DIAGNOSIS — B20 Human immunodeficiency virus [HIV] disease: Secondary | ICD-10-CM

## 2020-03-25 ENCOUNTER — Other Ambulatory Visit: Payer: Self-pay

## 2020-03-25 ENCOUNTER — Ambulatory Visit: Payer: Self-pay

## 2020-04-08 ENCOUNTER — Other Ambulatory Visit: Payer: Self-pay | Admitting: Internal Medicine

## 2020-04-08 DIAGNOSIS — B20 Human immunodeficiency virus [HIV] disease: Secondary | ICD-10-CM

## 2020-04-13 ENCOUNTER — Encounter: Payer: Self-pay | Admitting: Internal Medicine

## 2020-09-14 ENCOUNTER — Ambulatory Visit: Payer: Self-pay

## 2020-09-28 ENCOUNTER — Other Ambulatory Visit: Payer: Commercial Managed Care - PPO

## 2020-09-28 ENCOUNTER — Other Ambulatory Visit: Payer: Self-pay

## 2020-09-28 DIAGNOSIS — B20 Human immunodeficiency virus [HIV] disease: Secondary | ICD-10-CM

## 2020-09-29 LAB — T-HELPER CELL (CD4) - (RCID CLINIC ONLY)
CD4 % Helper T Cell: 49 % (ref 33–65)
CD4 T Cell Abs: 1024 /uL (ref 400–1790)

## 2020-10-01 LAB — HIV-1 RNA QUANT-NO REFLEX-BLD
HIV 1 RNA Quant: NOT DETECTED Copies/mL
HIV-1 RNA Quant, Log: NOT DETECTED Log cps/mL

## 2020-10-01 LAB — LIPID PANEL
Cholesterol: 294 mg/dL — ABNORMAL HIGH (ref <200)
HDL: 55 mg/dL (ref >=40)
LDL Cholesterol (Calc): 183 mg/dL (calc) — ABNORMAL HIGH
Non-HDL Cholesterol (Calc): 239 mg/dL (calc) — ABNORMAL HIGH (ref <130)
Total CHOL/HDL Ratio: 5.3 (calc) — ABNORMAL HIGH (ref <5.0)
Triglycerides: 331 mg/dL — ABNORMAL HIGH (ref <150)

## 2020-10-01 LAB — CBC
HCT: 41.5 % (ref 38.5–50.0)
Hemoglobin: 14.5 g/dL (ref 13.2–17.1)
MCH: 32.1 pg (ref 27.0–33.0)
MCHC: 34.9 g/dL (ref 32.0–36.0)
MCV: 91.8 fL (ref 80.0–100.0)
MPV: 9.7 fL (ref 7.5–12.5)
Platelets: 200 10*3/uL (ref 140–400)
RBC: 4.52 10*6/uL (ref 4.20–5.80)
RDW: 14.2 % (ref 11.0–15.0)
WBC: 5.7 10*3/uL (ref 3.8–10.8)

## 2020-10-01 LAB — COMPREHENSIVE METABOLIC PANEL
AG Ratio: 1.5 (calc) (ref 1.0–2.5)
ALT: 21 U/L (ref 9–46)
AST: 20 U/L (ref 10–35)
Albumin: 4.3 g/dL (ref 3.6–5.1)
Alkaline phosphatase (APISO): 93 U/L (ref 35–144)
BUN: 15 mg/dL (ref 7–25)
CO2: 26 mmol/L (ref 20–32)
Calcium: 9.6 mg/dL (ref 8.6–10.3)
Chloride: 106 mmol/L (ref 98–110)
Creat: 1.07 mg/dL (ref 0.70–1.25)
Globulin: 2.8 g/dL (calc) (ref 1.9–3.7)
Glucose, Bld: 92 mg/dL (ref 65–99)
Potassium: 4.2 mmol/L (ref 3.5–5.3)
Sodium: 140 mmol/L (ref 135–146)
Total Bilirubin: 0.4 mg/dL (ref 0.2–1.2)
Total Protein: 7.1 g/dL (ref 6.1–8.1)

## 2020-10-01 LAB — RPR: RPR Ser Ql: NONREACTIVE

## 2020-10-05 ENCOUNTER — Other Ambulatory Visit: Payer: Self-pay | Admitting: Internal Medicine

## 2020-10-05 DIAGNOSIS — B20 Human immunodeficiency virus [HIV] disease: Secondary | ICD-10-CM

## 2020-10-19 ENCOUNTER — Encounter: Payer: Self-pay | Admitting: Internal Medicine

## 2020-10-19 ENCOUNTER — Other Ambulatory Visit: Payer: Self-pay

## 2020-10-19 ENCOUNTER — Telehealth (INDEPENDENT_AMBULATORY_CARE_PROVIDER_SITE_OTHER): Payer: Commercial Managed Care - PPO | Admitting: Internal Medicine

## 2020-10-19 DIAGNOSIS — B20 Human immunodeficiency virus [HIV] disease: Secondary | ICD-10-CM

## 2020-10-19 MED ORDER — GENVOYA 150-150-200-10 MG PO TABS: 1.0000 | ORAL_TABLET | Freq: Every day | ORAL | 11 refills | Status: DC

## 2020-10-19 NOTE — Progress Notes (Signed)
Virtual Visit via Video Note  I connected with Edwin [name] on 10/19/20 at  3:30 PM EDT by a video enabled telemedicine application and verified that I am speaking with the correct person using two identifiers.  Location: Patient: Home Provider: [facility]   I discussed the limitations of evaluation and management by telemedicine and the availability of in person appointments. The patient expressed understanding and agreed to proceed.  History of Present Illness: I called and spoke with Edwin [name] today.  He denies any problems obtaining, taking or tolerating his Genvoya and does not recall missing any doses.  He takes it each morning with breakfast.  He is not on any new medications.  He has not received an influenza or Covid vaccine yet.  He is feeling well.   Observations/Objective: HIV 1 RNA Quant  Date Value  09/28/2020 Not Detected Copies/mL  09/11/2019 <20 NOT DETECTED copies/mL  11/05/2018 <20 DETECTED copies/mL (A)   CD4 T Cell Abs (/uL)  Date Value  09/28/2020 1,024  09/11/2019 833  11/05/2018 880    Assessment and Plan: His infection remains under excellent, long-term control.  Follow Up Instructions: Continue Genvoya and follow-up after lab work in 1 year   I discussed the assessment and treatment plan with the patient. The patient was provided an opportunity to ask questions and all were answered. The patient agreed with the plan and demonstrated an understanding of the instructions.   The patient was advised to call back or seek an in-person evaluation if the symptoms worsen or if the condition fails to improve as anticipated.  I provided 15 minutes of non-face-to-face time during this encounter.   Cliffton Asters, MD

## 2020-10-25 ENCOUNTER — Ambulatory Visit: Payer: Commercial Managed Care - PPO

## 2020-10-29 ENCOUNTER — Encounter: Payer: Self-pay | Admitting: Internal Medicine

## 2021-10-19 ENCOUNTER — Other Ambulatory Visit: Payer: Commercial Managed Care - PPO

## 2021-10-19 ENCOUNTER — Other Ambulatory Visit: Payer: Self-pay

## 2021-10-19 DIAGNOSIS — B20 Human immunodeficiency virus [HIV] disease: Secondary | ICD-10-CM

## 2021-10-20 LAB — T-HELPER CELL (CD4) - (RCID CLINIC ONLY)
CD4 % Helper T Cell: 19 % — ABNORMAL LOW (ref 33–65)
CD4 T Cell Abs: 184 /uL — ABNORMAL LOW (ref 400–1790)

## 2021-10-22 LAB — COMPREHENSIVE METABOLIC PANEL
AG Ratio: 1.6 (calc) (ref 1.0–2.5)
ALT: 38 U/L (ref 9–46)
AST: 35 U/L (ref 10–35)
Albumin: 4 g/dL (ref 3.6–5.1)
Alkaline phosphatase (APISO): 103 U/L (ref 35–144)
BUN: 15 mg/dL (ref 7–25)
CO2: 27 mmol/L (ref 20–32)
Calcium: 8.8 mg/dL (ref 8.6–10.3)
Chloride: 107 mmol/L (ref 98–110)
Creat: 0.96 mg/dL (ref 0.70–1.35)
Globulin: 2.5 g/dL (calc) (ref 1.9–3.7)
Glucose, Bld: 119 mg/dL — ABNORMAL HIGH (ref 65–99)
Potassium: 4.3 mmol/L (ref 3.5–5.3)
Sodium: 141 mmol/L (ref 135–146)
Total Bilirubin: 0.4 mg/dL (ref 0.2–1.2)
Total Protein: 6.5 g/dL (ref 6.1–8.1)

## 2021-10-22 LAB — RPR: RPR Ser Ql: NONREACTIVE

## 2021-10-22 LAB — CBC
HCT: 39.7 % (ref 38.5–50.0)
Hemoglobin: 13.7 g/dL (ref 13.2–17.1)
MCH: 31.3 pg (ref 27.0–33.0)
MCHC: 34.5 g/dL (ref 32.0–36.0)
MCV: 90.6 fL (ref 80.0–100.0)
MPV: 10.5 fL (ref 7.5–12.5)
Platelets: 129 10*3/uL — ABNORMAL LOW (ref 140–400)
RBC: 4.38 10*6/uL (ref 4.20–5.80)
RDW: 13.2 % (ref 11.0–15.0)
WBC: 2.7 10*3/uL — ABNORMAL LOW (ref 3.8–10.8)

## 2021-10-22 LAB — LIPID PANEL
Cholesterol: 187 mg/dL (ref <200)
HDL: 36 mg/dL — ABNORMAL LOW (ref >=40)
Non-HDL Cholesterol (Calc): 151 mg/dL (calc) — ABNORMAL HIGH (ref <130)
Total CHOL/HDL Ratio: 5.2 (calc) — ABNORMAL HIGH (ref <5.0)
Triglycerides: 444 mg/dL — ABNORMAL HIGH (ref <150)

## 2021-10-22 LAB — HIV-1 RNA QUANT-NO REFLEX-BLD
HIV 1 RNA Quant: 624000 Copies/mL — ABNORMAL HIGH
HIV-1 RNA Quant, Log: 5.8 Log cps/mL — ABNORMAL HIGH

## 2021-10-26 ENCOUNTER — Ambulatory Visit: Payer: Commercial Managed Care - PPO | Admitting: Internal Medicine

## 2021-11-01 ENCOUNTER — Ambulatory Visit: Payer: Commercial Managed Care - PPO

## 2021-11-01 ENCOUNTER — Encounter: Payer: Self-pay | Admitting: Internal Medicine

## 2021-11-01 ENCOUNTER — Other Ambulatory Visit: Payer: Self-pay

## 2021-11-01 ENCOUNTER — Ambulatory Visit (INDEPENDENT_AMBULATORY_CARE_PROVIDER_SITE_OTHER): Payer: Commercial Managed Care - PPO | Admitting: Internal Medicine

## 2021-11-01 DIAGNOSIS — B20 Human immunodeficiency virus [HIV] disease: Secondary | ICD-10-CM

## 2021-11-01 MED ORDER — GENVOYA 150-150-200-10 MG PO TABS: 1.0000 | ORAL_TABLET | Freq: Every day | ORAL | 11 refills | Status: DC

## 2021-11-01 MED ORDER — SULFAMETHOXAZOLE-TRIMETHOPRIM 800-160 MG PO TABS: 1.0000 | ORAL_TABLET | Freq: Every day | ORAL | 5 refills | Status: DC

## 2021-11-01 NOTE — Assessment & Plan Note (Signed)
His HIV infection reactivated as expected after stopping Genvoya recently.  He has had a precipitous drop in his CD4 count.  I have given him a temporary 2-week supply of Biktarvy pending a dab renewal and getting a new supply of Genvoya.  I also started him on prophylactic trimethoprim sulfamethoxazole.  He will follow-up for repeat blood work in 6 weeks. ?

## 2021-11-01 NOTE — Progress Notes (Signed)
? ?   ? ? ? ? ?Patient Active Problem List  ? Diagnosis Date Noted  ? Human immunodeficiency virus (HIV) disease (HCC) 08/10/2006  ?  Priority: High  ? Hyperglycemia 08/03/2014  ?  Priority: Medium   ? Obesity 11/23/2008  ?  Priority: Medium   ? Dyslipidemia 08/10/2006  ?  Priority: Medium   ? Hordeolum 05/20/2013  ? Hearing loss in right ear 01/03/2012  ? Seasonal allergies 01/03/2011  ? GYNECOMASTIA 08/29/2006  ? HEPATITIS B 08/10/2006  ? VENEREAL WART 08/10/2006  ? DENTAL CARIES 08/10/2006  ? TOBACCO USE, QUIT 08/10/2006  ? PNEUMOCYSTIS PNEUMONIA 10/29/2004  ? ? ?Patient's Medications  ?New Prescriptions  ? SULFAMETHOXAZOLE-TRIMETHOPRIM (BACTRIM DS) 800-160 MG TABLET    Take 1 tablet by mouth daily.  ?Previous Medications  ? AMLODIPINE (NORVASC) 5 MG TABLET    Take 1 tablet (5 mg total) by mouth daily. At bedtime to lower cholesterol  ? FISH OIL-OMEGA-3 FATTY ACIDS 1000 MG CAPSULE    Take 3 g by mouth daily.  ? LORATADINE (CLARITIN) 10 MG TABLET    Take 10 mg by mouth daily.  ?Modified Medications  ? Modified Medication Previous Medication  ? ELVITEGRAVIR-COBICISTAT-EMTRICITABINE-TENOFOVIR (GENVOYA) 150-150-200-10 MG TABS TABLET elvitegravir-cobicistat-emtricitabine-tenofovir (GENVOYA) 150-150-200-10 MG TABS tablet  ?    Take 1 tablet by mouth daily with breakfast.    Take 1 tablet by mouth daily with breakfast.  ?Discontinued Medications  ? No medications on file  ? ? ?Subjective: ?Derwood is in for his routine HIV visit.  He recently got tired of taking Genvoya and was worried that it might be causing some hair loss.  He failed to renew his a dap and stopped taking Genvoya several months ago.  Fortunately he is still feeling well. ? ?Review of Systems: ?Review of Systems  ?Constitutional:  Negative for fever and weight loss.  ? ?Past Medical History:  ?Diagnosis Date  ? HIV infection (HCC)   ? Hyperlipidemia   ? ? ?Social History  ? ?Tobacco Use  ? Smoking status: Former  ?  Years: 12.00  ?  Types: Cigarettes   ?  Quit date: 07/31/2004  ?  Years since quitting: 17.2  ? Smokeless tobacco: Never  ?Vaping Use  ? Vaping Use: Never used  ?Substance Use Topics  ? Alcohol use: Not Currently  ?  Alcohol/week: 1.0 standard drink  ?  Types: 1 Standard drinks or equivalent per week  ?  Comment: wine  ? Drug use: No  ? ? ?Family History  ?Problem Relation Age of Onset  ? Hypertension Father   ? Cancer Mother   ? ? ?No Known Allergies ? ?Health Maintenance  ?Topic Date Due  ? COVID-19 Vaccine (1) Never done  ? Zoster Vaccines- Shingrix (1 of 2) Never done  ? COLONOSCOPY (Pts 45-31yrs Insurance coverage will need to be confirmed)  Never done  ? INFLUENZA VACCINE  02/28/2022  ? TETANUS/TDAP  10/26/2022  ? Hepatitis C Screening  Completed  ? HIV Screening  Completed  ? HPV VACCINES  Aged Out  ? ? ?Objective: ? ?Vitals:  ? 11/01/21 1524  ?BP: (!) 150/71  ?Pulse: 89  ?Temp: 98.1 ?F (36.7 ?C)  ?TempSrc: Temporal  ?SpO2: 95%  ?Weight: 208 lb (94.3 kg)  ? ?Body mass index is 31.63 kg/m?. ? ?Physical Exam ?Constitutional:   ?   General: He is not in acute distress. ?   Appearance: Normal appearance.  ?Cardiovascular:  ?   Rate and Rhythm: Normal rate  and regular rhythm.  ?   Heart sounds: No murmur heard. ?Pulmonary:  ?   Effort: Pulmonary effort is normal.  ?   Breath sounds: Normal breath sounds.  ?Psychiatric:     ?   Mood and Affect: Mood normal.  ? ? ?Lab Results ?Lab Results  ?Component Value Date  ? WBC 2.7 (L) 10/19/2021  ? HGB 13.7 10/19/2021  ? HCT 39.7 10/19/2021  ? MCV 90.6 10/19/2021  ? PLT 129 (L) 10/19/2021  ?  ?Lab Results  ?Component Value Date  ? CREATININE 0.96 10/19/2021  ? BUN 15 10/19/2021  ? NA 141 10/19/2021  ? K 4.3 10/19/2021  ? CL 107 10/19/2021  ? CO2 27 10/19/2021  ?  ?Lab Results  ?Component Value Date  ? ALT 38 10/19/2021  ? AST 35 10/19/2021  ? ALKPHOS 96 10/12/2016  ? BILITOT 0.4 10/19/2021  ?  ?Lab Results  ?Component Value Date  ? CHOL 187 10/19/2021  ? HDL 36 (L) 10/19/2021  ? LDLCALC  10/19/2021  ?   Comment:   ?   . ?LDL cholesterol not calculated. Triglyceride levels ?greater than 400 mg/dL invalidate calculated LDL results. ?. ?Reference range: <100 ?Marland Kitchen ?Desirable range <100 mg/dL for primary prevention;   ?<70 mg/dL for patients with CHD or diabetic patients  ?with > or = 2 CHD risk factors. ?. ?LDL-C is now calculated using the Martin-Hopkins  ?calculation, which is a validated novel method providing  ?better accuracy than the Friedewald equation in the  ?estimation of LDL-C.  ?Horald Pollen et al. Lenox Ahr. 7106;269(48): 2061-2068  ?(http://education.Quest[employer]ostics.com/faq/FAQ164) ?  ? TRIG 444 (H) 10/19/2021  ? CHOLHDL 5.2 (H) 10/19/2021  ? ?Lab Results  ?Component Value Date  ? LABRPR NON-REACTIVE 10/19/2021  ? ?HIV 1 RNA Quant  ?Date Value  ?10/19/2021 624,000 Copies/mL (H)  ?09/28/2020 Not Detected Copies/mL  ?09/11/2019 <20 NOT DETECTED copies/mL  ? ?CD4 T Cell Abs (/uL)  ?Date Value  ?10/19/2021 184 (L)  ?09/28/2020 1,024  ?09/11/2019 833  ? ?  ?Problem List Items Addressed This Visit   ? ?  ? High  ? Human immunodeficiency virus (HIV) disease (HCC)  ?  His HIV infection reactivated as expected after stopping Genvoya recently.  He has had a precipitous drop in his CD4 count.  I have given him a temporary 2-week supply of Biktarvy pending a dab renewal and getting a new supply of Genvoya.  I also started him on prophylactic trimethoprim sulfamethoxazole.  He will follow-up for repeat blood work in 6 weeks. ?  ?  ? Relevant Medications  ? elvitegravir-cobicistat-emtricitabine-tenofovir (GENVOYA) 150-150-200-10 MG TABS tablet  ? sulfamethoxazole-trimethoprim (BACTRIM DS) 800-160 MG tablet  ? ? ? ? ?Cliffton Asters, MD ?[facility] for Infectious Disease ?[facility] ?[phone] pager   [phone] cell ?11/01/2021, 3:44 PM ? ?

## 2021-11-02 ENCOUNTER — Other Ambulatory Visit: Payer: Self-pay

## 2021-11-02 DIAGNOSIS — B20 Human immunodeficiency virus [HIV] disease: Secondary | ICD-10-CM

## 2021-11-02 MED ORDER — GENVOYA 150-150-200-10 MG PO TABS: 1.0000 | ORAL_TABLET | Freq: Every day | ORAL | 11 refills | Status: DC

## 2021-11-02 MED ORDER — SULFAMETHOXAZOLE-TRIMETHOPRIM 800-160 MG PO TABS: 1.0000 | ORAL_TABLET | Freq: Every day | ORAL | 5 refills | Status: DC

## 2021-11-03 ENCOUNTER — Other Ambulatory Visit: Payer: Self-pay | Admitting: Pharmacist

## 2021-11-03 DIAGNOSIS — B20 Human immunodeficiency virus [HIV] disease: Secondary | ICD-10-CM

## 2021-11-03 MED ORDER — BICTEGRAVIR-EMTRICITAB-TENOFOV 50-200-25 MG PO TABS: 1.0000 | ORAL_TABLET | Freq: Every day | ORAL | 0 refills | Status: AC

## 2021-11-03 NOTE — Progress Notes (Signed)
Medication Samples have been provided to the patient. ? ?Drug name: Biktarvy        ?Strength: 50/200/25 mg       ?Qty: 14 tablets (2 bottles) ?LOT: [id number]   ?Exp.Date: 10/24 ? ?Dosing instructions: Take one tablet by mouth once daily ? ?The patient has been instructed regarding the correct time, dose, and frequency of taking this medication, including desired effects and most common side effects.  ? ?Kayra Crowell, PharmD, CPP ?Clinical Pharmacist Practitioner ?Infectious Diseases Clinical Pharmacist ?[facility] ? ?

## 2021-11-07 ENCOUNTER — Encounter: Payer: Self-pay | Admitting: Internal Medicine

## 2021-11-14 ENCOUNTER — Other Ambulatory Visit: Payer: Self-pay

## 2021-11-14 DIAGNOSIS — B20 Human immunodeficiency virus [HIV] disease: Secondary | ICD-10-CM

## 2021-11-14 MED ORDER — GENVOYA 150-150-200-10 MG PO TABS: 1.0000 | ORAL_TABLET | Freq: Every day | ORAL | 11 refills | Status: DC

## 2021-11-15 ENCOUNTER — Other Ambulatory Visit (HOSPITAL_COMMUNITY): Payer: Self-pay

## 2021-11-16 ENCOUNTER — Telehealth: Payer: Self-pay

## 2021-11-16 ENCOUNTER — Other Ambulatory Visit (HOSPITAL_COMMUNITY): Payer: Self-pay

## 2021-11-16 NOTE — Telephone Encounter (Signed)
[facility] Patient Advocate Encounter ?  ?Received notification from [employer] that prior authorization for Darrin Luis is required. ?  ?PA submitted on 11/16/21 ?Key [id number] ?Status is pending ?   ?[facility] will continue to follow. ? ? ?Clearance Coots, CPhT ?Specialty Pharmacy Patient Advocate ?[facility] ?Phone: [phone] ?Fax:  [phone]  ?

## 2021-11-17 ENCOUNTER — Other Ambulatory Visit: Payer: Self-pay | Admitting: Pharmacist

## 2021-11-17 ENCOUNTER — Other Ambulatory Visit (HOSPITAL_COMMUNITY): Payer: Self-pay

## 2021-11-17 DIAGNOSIS — B20 Human immunodeficiency virus [HIV] disease: Secondary | ICD-10-CM

## 2021-11-17 MED ORDER — BICTEGRAVIR-EMTRICITAB-TENOFOV 50-200-25 MG PO TABS: 1.0000 | ORAL_TABLET | Freq: Every day | ORAL | 0 refills | Status: AC

## 2021-11-17 NOTE — Progress Notes (Signed)
Medication Samples have been provided to the patient. ? ?Drug name: Biktarvy        ?Strength: 50/200/25 mg       ?Qty: 2 tablets (14 bottles) ?LOT: [id number]   ?Exp.Date: 10/24 ? ?Dosing instructions: Take one tablet by mouth once daily ? ?The patient has been instructed regarding the correct time, dose, and frequency of taking this medication, including desired effects and most common side effects.  ? ?Alfonse Spruce, PharmD, CPP ?Clinical Pharmacist Practitioner ?Infectious Diseases Clinical Pharmacist ?[place] [facility] ? ?

## 2021-11-28 ENCOUNTER — Other Ambulatory Visit (HOSPITAL_COMMUNITY): Payer: Self-pay

## 2021-11-28 ENCOUNTER — Other Ambulatory Visit: Payer: Self-pay | Admitting: Pharmacist

## 2021-11-28 ENCOUNTER — Telehealth: Payer: Self-pay

## 2021-11-28 DIAGNOSIS — B20 Human immunodeficiency virus [HIV] disease: Secondary | ICD-10-CM

## 2021-11-28 MED ORDER — GENVOYA 150-150-200-10 MG PO TABS
1.0000 | ORAL_TABLET | Freq: Every day | ORAL | 0 refills | Status: DC
  Filled 2021-11-28: qty 30, 30d supply, fill #0

## 2021-11-28 MED ORDER — GENVOYA 150-150-200-10 MG PO TABS
1.0000 | ORAL_TABLET | Freq: Every day | ORAL | 1 refills | Status: DC
  Filled 2021-11-28 – 2021-12-23 (×2): qty 30, 30d supply, fill #0
  Filled 2022-01-19: qty 30, 30d supply, fill #1

## 2021-11-28 NOTE — Progress Notes (Signed)
Sending Genvoya script to [facility] in order for our pharmacy team to follow PA approval and ability to fill.  ? ?Alfonse Spruce, PharmD, CPP ?Clinical Pharmacist Practitioner ?Infectious Diseases Clinical Pharmacist ?[place] [facility] ? ?

## 2021-11-28 NOTE — Telephone Encounter (Addendum)
[facility] Patient Advocate Encounter ? ?Completed and sent [employer] Advancing Access application for Genvoya for this patient who is uninsured.   ? ?Patient is approved 11/28/21 through 11/28/22.. ? ? ? ? ? ? ?Clearance Coots, CPhT ?Specialty Pharmacy Patient Advocate ?[facility] ?Phone: [phone] ?Fax:  [phone]  ?

## 2021-12-13 ENCOUNTER — Other Ambulatory Visit: Payer: Self-pay

## 2021-12-13 ENCOUNTER — Encounter: Payer: Self-pay | Admitting: Internal Medicine

## 2021-12-13 ENCOUNTER — Ambulatory Visit (INDEPENDENT_AMBULATORY_CARE_PROVIDER_SITE_OTHER): Payer: Commercial Managed Care - PPO | Admitting: Internal Medicine

## 2021-12-13 DIAGNOSIS — B20 Human immunodeficiency virus [HIV] disease: Secondary | ICD-10-CM

## 2021-12-13 NOTE — Progress Notes (Signed)
? ?   ? ? ? ? ?Patient Active Problem List  ? Diagnosis Date Noted  ? Human immunodeficiency virus (HIV) disease ([place]) 08/10/2006  ?  Priority: High  ? Hyperglycemia 08/03/2014  ?  Priority: Medium   ? Obesity 11/23/2008  ?  Priority: Medium   ? Dyslipidemia 08/10/2006  ?  Priority: Medium   ? Hordeolum 05/20/2013  ? Hearing loss in right ear 01/03/2012  ? Seasonal allergies 01/03/2011  ? GYNECOMASTIA 08/29/2006  ? HEPATITIS B 08/10/2006  ? VENEREAL WART 08/10/2006  ? DENTAL CARIES 08/10/2006  ? TOBACCO USE, QUIT 08/10/2006  ? PNEUMOCYSTIS PNEUMONIA 10/29/2004  ? ? ?Patient's Medications  ?New Prescriptions  ? No medications on file  ?Previous Medications  ? AMLODIPINE (NORVASC) 5 MG TABLET    Take 1 tablet (5 mg total) by mouth daily. At bedtime to lower cholesterol  ? ELVITEGRAVIR-COBICISTAT-EMTRICITABINE-TENOFOVIR (GENVOYA) 150-150-200-10 MG TABS TABLET    Take 1 tablet by mouth daily with breakfast.  ? FISH OIL-OMEGA-3 FATTY ACIDS 1000 MG CAPSULE    Take 3 g by mouth daily.  ? GENVOYA 150-150-200-10 MG TABS TABLET    take 1 tablet daily  ? LORATADINE (CLARITIN) 10 MG TABLET    Take 10 mg by mouth daily.  ? SULFAMETHOXAZOLE-TRIMETHOPRIM (BACTRIM DS) 800-160 MG TABLET    Take 1 tablet by mouth daily.  ?Modified Medications  ? No medications on file  ?Discontinued Medications  ? No medications on file  ? ? ?Subjective: ?Edwin [name] is in for his routine HIV follow-up visit.  When I saw him last month he had stopped taking his Genvoya because he was worried that it might be causing some hair loss.  He also had failed to renew his ADAP.  He had been off of Genvoya for several months before his last blood work which showed that his CD4 count was way down to 184 and his viral load was elevated at 624,000.  He was given Biktarvy samples.  Jorje Guild has now been approved for him.  He denies missing any doses since his last visit.  He is taking it with food.  He is no longer worried about hair loss or other potential side  effects. ? ?Review of Systems: ?Review of Systems  ?Constitutional:  Positive for diaphoresis. Negative for fever and weight loss.  ?Respiratory:  Negative for cough.   ?Cardiovascular:  Negative for chest pain.  ?Skin:  Negative for rash.  ? ?Past Medical History:  ?Diagnosis Date  ? HIV infection ([place])   ? Hyperlipidemia   ? ? ?Social History  ? ?Tobacco Use  ? Smoking status: Former  ?  Years: 12.00  ?  Types: Cigarettes  ?  Quit date: 07/31/2004  ?  Years since quitting: 17.3  ? Smokeless tobacco: Never  ?Vaping Use  ? Vaping Use: Never used  ?Substance Use Topics  ? Alcohol use: Not Currently  ?  Alcohol/week: 1.0 standard drink  ?  Types: 1 Standard drinks or equivalent per week  ?  Comment: wine  ? Drug use: No  ? ? ?Family History  ?Problem Relation Age of Onset  ? Hypertension Father   ? Cancer Mother   ? ? ?No Known Allergies ? ?Health Maintenance  ?Topic Date Due  ? COVID-19 Vaccine (1) Never done  ? Zoster Vaccines- Shingrix (1 of 2) Never done  ? COLONOSCOPY (Pts 45-65yrs Insurance coverage will need to be confirmed)  Never done  ? INFLUENZA VACCINE  02/28/2022  ? TETANUS/TDAP  10/26/2022  ? Hepatitis C Screening  Completed  ? HIV Screening  Completed  ? HPV VACCINES  Aged Out  ? ? ?Objective: ? ?Vitals:  ? 12/13/21 1518  ?BP: 127/70  ?Pulse: 69  ?Temp: 98.2 ?F (36.8 ?C)  ?TempSrc: Temporal  ?Weight: 204 lb (92.5 kg)  ? ?Body mass index is 31.02 kg/m?. ? ?Physical Exam ?Constitutional:   ?   Comments: He is in good spirits.  ?Cardiovascular:  ?   Rate and Rhythm: Normal rate.  ?Pulmonary:  ?   Effort: Pulmonary effort is normal.  ?Psychiatric:     ?   Mood and Affect: Mood normal.  ? ? ?Lab Results ?Lab Results  ?Component Value Date  ? WBC 2.7 (L) 10/19/2021  ? HGB 13.7 10/19/2021  ? HCT 39.7 10/19/2021  ? MCV 90.6 10/19/2021  ? PLT 129 (L) 10/19/2021  ?  ?Lab Results  ?Component Value Date  ? CREATININE 0.96 10/19/2021  ? BUN 15 10/19/2021  ? NA 141 10/19/2021  ? K 4.3 10/19/2021  ? CL 107 10/19/2021  ?  CO2 27 10/19/2021  ?  ?Lab Results  ?Component Value Date  ? ALT 38 10/19/2021  ? AST 35 10/19/2021  ? ALKPHOS 96 10/12/2016  ? BILITOT 0.4 10/19/2021  ?  ?Lab Results  ?Component Value Date  ? CHOL 187 10/19/2021  ? HDL 36 (L) 10/19/2021  ? [place]  10/19/2021  ?   Comment:  ?   . ?LDL cholesterol not calculated. Triglyceride levels ?greater than 400 mg/dL invalidate calculated LDL results. ?. ?Reference range: <100 ?Marland Kitchen ?Desirable range <100 mg/dL for primary prevention;   ?<70 mg/dL for patients with CHD or diabetic patients  ?with > or = 2 CHD risk factors. ?. ?LDL-C is now calculated using the Martin-Hopkins  ?calculation, which is a validated novel method providing  ?better accuracy than the Friedewald equation in the  ?estimation of LDL-C.  ?Cresenciano Genre et al. Annamaria Helling. MU:[id number]): 2061-2068  ?(http://education.Quest[facility].com/faq/FAQ164) ?  ? TRIG 444 (H) 10/19/2021  ? CHOLHDL 5.2 (H) 10/19/2021  ? ?Lab Results  ?Component Value Date  ? LABRPR NON-REACTIVE 10/19/2021  ? ?HIV 1 RNA Quant  ?Date Value  ?10/19/2021 624,000 Copies/mL (H)  ?09/28/2020 Not Detected Copies/mL  ?09/11/2019 <20 NOT DETECTED copies/mL  ? ?CD4 T Cell Abs (/uL)  ?Date Value  ?10/19/2021 184 (L)  ?09/28/2020 1,024  ?09/11/2019 833  ? ?  ?Problem List Items Addressed This Visit   ? ?  ? High  ? Human immunodeficiency virus (HIV) disease ([place])  ?  I hope and expect that we will see that his viral load has rapidly suppressed and he is having good CD4 reconstitution.  We will continue Genvoya, get lab work today and follow-up in 6 months. ? ?  ?  ? Relevant Orders  ? T-helper cells (CD4) count (not at [facility])  ? HIV-1 RNA quant-no reflex-bld  ? ? ? ? ?Michel Bickers, MD ?[facility] for Infectious Disease ?[facility] ?[phone] pager   [phone] cell ?12/13/2021, 3:31 PM ? ?

## 2021-12-13 NOTE — Assessment & Plan Note (Signed)
I hope and expect that we will see that his viral load has rapidly suppressed and he is having good CD4 reconstitution.  We will continue Genvoya, get lab work today and follow-up in 6 months. ?

## 2021-12-14 ENCOUNTER — Encounter: Payer: Self-pay | Admitting: Internal Medicine

## 2021-12-14 LAB — T-HELPER CELLS (CD4) COUNT (NOT AT ARMC)
CD4 % Helper T Cell: 35 % (ref 33–65)
CD4 T Cell Abs: 439 /uL (ref 400–1790)

## 2021-12-17 LAB — HIV-1 RNA QUANT-NO REFLEX-BLD
HIV 1 RNA Quant: 40 copies/mL — ABNORMAL HIGH
HIV-1 RNA Quant, Log: 1.6 Log copies/mL — ABNORMAL HIGH

## 2021-12-20 ENCOUNTER — Other Ambulatory Visit (HOSPITAL_COMMUNITY): Payer: Self-pay

## 2021-12-23 ENCOUNTER — Other Ambulatory Visit (HOSPITAL_COMMUNITY): Payer: Self-pay

## 2021-12-27 ENCOUNTER — Other Ambulatory Visit (HOSPITAL_COMMUNITY): Payer: Self-pay

## 2022-01-18 ENCOUNTER — Other Ambulatory Visit (HOSPITAL_COMMUNITY): Payer: Self-pay

## 2022-01-19 ENCOUNTER — Other Ambulatory Visit (HOSPITAL_COMMUNITY): Payer: Self-pay

## 2022-01-23 ENCOUNTER — Other Ambulatory Visit (HOSPITAL_COMMUNITY): Payer: Self-pay

## 2022-02-14 ENCOUNTER — Other Ambulatory Visit: Payer: Self-pay

## 2022-02-14 ENCOUNTER — Other Ambulatory Visit: Payer: Self-pay | Admitting: Pharmacist

## 2022-02-14 ENCOUNTER — Other Ambulatory Visit (HOSPITAL_COMMUNITY): Payer: Self-pay

## 2022-02-14 DIAGNOSIS — B20 Human immunodeficiency virus [HIV] disease: Secondary | ICD-10-CM

## 2022-02-14 MED ORDER — GENVOYA 150-150-200-10 MG PO TABS
1.0000 | ORAL_TABLET | Freq: Every day | ORAL | 4 refills | Status: DC
  Filled 2022-02-14: qty 30, 30d supply, fill #0
  Filled 2022-03-13: qty 30, 30d supply, fill #1
  Filled 2022-04-14: qty 30, 30d supply, fill #2
  Filled 2022-05-09: qty 30, 30d supply, fill #3
  Filled 2022-06-05: qty 30, 30d supply, fill #4

## 2022-02-17 ENCOUNTER — Other Ambulatory Visit (HOSPITAL_COMMUNITY): Payer: Self-pay

## 2022-03-13 ENCOUNTER — Other Ambulatory Visit (HOSPITAL_COMMUNITY): Payer: Self-pay

## 2022-03-17 ENCOUNTER — Other Ambulatory Visit (HOSPITAL_COMMUNITY): Payer: Self-pay

## 2022-04-10 ENCOUNTER — Other Ambulatory Visit (HOSPITAL_COMMUNITY): Payer: Self-pay

## 2022-04-12 ENCOUNTER — Other Ambulatory Visit (HOSPITAL_COMMUNITY): Payer: Self-pay

## 2022-04-14 ENCOUNTER — Other Ambulatory Visit (HOSPITAL_COMMUNITY): Payer: Self-pay

## 2022-04-17 ENCOUNTER — Other Ambulatory Visit (HOSPITAL_COMMUNITY): Payer: Self-pay

## 2022-05-09 ENCOUNTER — Other Ambulatory Visit (HOSPITAL_COMMUNITY): Payer: Self-pay

## 2022-05-17 ENCOUNTER — Other Ambulatory Visit (HOSPITAL_COMMUNITY): Payer: Self-pay

## 2022-05-25 ENCOUNTER — Other Ambulatory Visit: Payer: Self-pay

## 2022-05-25 DIAGNOSIS — B20 Human immunodeficiency virus [HIV] disease: Secondary | ICD-10-CM

## 2022-05-30 ENCOUNTER — Other Ambulatory Visit: Payer: Self-pay

## 2022-05-30 ENCOUNTER — Other Ambulatory Visit: Payer: Commercial Managed Care - PPO

## 2022-05-30 DIAGNOSIS — B20 Human immunodeficiency virus [HIV] disease: Secondary | ICD-10-CM

## 2022-05-31 LAB — T-HELPER CELL (CD4) - (RCID CLINIC ONLY)
CD4 % Helper T Cell: 39 % (ref 33–65)
CD4 T Cell Abs: 564 /uL (ref 400–1790)

## 2022-06-01 LAB — CBC WITH DIFFERENTIAL/PLATELET
Absolute Monocytes: 395 cells/uL (ref 200–950)
Basophils Absolute: 40 cells/uL (ref 0–200)
Basophils Relative: 0.8 %
Eosinophils Absolute: 335 cells/uL (ref 15–500)
Eosinophils Relative: 6.7 %
HCT: 39.7 % (ref 38.5–50.0)
Hemoglobin: 13.9 g/dL (ref 13.2–17.1)
Lymphs Abs: 1595 cells/uL (ref 850–3900)
MCH: 32.9 pg (ref 27.0–33.0)
MCHC: 35 g/dL (ref 32.0–36.0)
MCV: 93.9 fL (ref 80.0–100.0)
MPV: 10 fL (ref 7.5–12.5)
Monocytes Relative: 7.9 %
Neutro Abs: 2635 cells/uL (ref 1500–7800)
Neutrophils Relative %: 52.7 %
Platelets: 182 10*3/uL (ref 140–400)
RBC: 4.23 10*6/uL (ref 4.20–5.80)
RDW: 14 % (ref 11.0–15.0)
Total Lymphocyte: 31.9 %
WBC: 5 10*3/uL (ref 3.8–10.8)

## 2022-06-01 LAB — COMPLETE METABOLIC PANEL WITH GFR
AG Ratio: 1.6 (calc) (ref 1.0–2.5)
ALT: 20 U/L (ref 9–46)
AST: 19 U/L (ref 10–35)
Albumin: 4.2 g/dL (ref 3.6–5.1)
Alkaline phosphatase (APISO): 100 U/L (ref 35–144)
BUN: 12 mg/dL (ref 7–25)
CO2: 28 mmol/L (ref 20–32)
Calcium: 9.1 mg/dL (ref 8.6–10.3)
Chloride: 105 mmol/L (ref 98–110)
Creat: 1.05 mg/dL (ref 0.70–1.35)
Globulin: 2.7 g/dL (calc) (ref 1.9–3.7)
Glucose, Bld: 125 mg/dL — ABNORMAL HIGH (ref 65–99)
Potassium: 4.4 mmol/L (ref 3.5–5.3)
Sodium: 140 mmol/L (ref 135–146)
Total Bilirubin: 0.4 mg/dL (ref 0.2–1.2)
Total Protein: 6.9 g/dL (ref 6.1–8.1)
eGFR: 80 mL/min/{1.73_m2} (ref >=60)

## 2022-06-01 LAB — HIV-1 RNA QUANT-NO REFLEX-BLD
HIV 1 RNA Quant: 58 Copies/mL — ABNORMAL HIGH
HIV-1 RNA Quant, Log: 1.77 Log cps/mL — ABNORMAL HIGH

## 2022-06-05 ENCOUNTER — Other Ambulatory Visit (HOSPITAL_COMMUNITY): Payer: Self-pay

## 2022-06-13 ENCOUNTER — Encounter: Payer: Self-pay | Admitting: Internal Medicine

## 2022-06-13 ENCOUNTER — Ambulatory Visit (INDEPENDENT_AMBULATORY_CARE_PROVIDER_SITE_OTHER): Payer: Commercial Managed Care - PPO | Admitting: Internal Medicine

## 2022-06-13 ENCOUNTER — Ambulatory Visit: Payer: Commercial Managed Care - PPO | Admitting: Internal Medicine

## 2022-06-13 ENCOUNTER — Other Ambulatory Visit: Payer: Self-pay

## 2022-06-13 ENCOUNTER — Other Ambulatory Visit (HOSPITAL_COMMUNITY): Payer: Self-pay

## 2022-06-13 VITALS — BP 150/70 | HR 77 | Temp 97.9°F | Ht 68.0 in | Wt 208.0 lb

## 2022-06-13 DIAGNOSIS — B20 Human immunodeficiency virus [HIV] disease: Secondary | ICD-10-CM | POA: Diagnosis not present

## 2022-06-13 MED ORDER — GENVOYA 150-150-200-10 MG PO TABS
1.0000 | ORAL_TABLET | Freq: Every day | ORAL | 11 refills | Status: DC
  Filled 2022-06-13 – 2022-07-14 (×3): qty 30, 30d supply, fill #0
  Filled 2022-08-13 – 2022-08-14 (×2): qty 30, 30d supply, fill #1
  Filled 2022-09-06: qty 30, 30d supply, fill #2
  Filled 2022-10-04: qty 30, 30d supply, fill #3
  Filled 2022-11-07: qty 30, 30d supply, fill #4

## 2022-06-13 NOTE — Progress Notes (Signed)
Patient Active Problem List   Diagnosis Date Noted   Human immunodeficiency virus (HIV) disease ([place]) 08/10/2006    Priority: High   Hyperglycemia 08/03/2014    Priority: Medium    Obesity 11/23/2008    Priority: Medium    Dyslipidemia 08/10/2006    Priority: Medium    Hordeolum 05/20/2013   Hearing loss in right ear 01/03/2012   Seasonal allergies 01/03/2011   GYNECOMASTIA 08/29/2006   HEPATITIS B 08/10/2006   VENEREAL WART 08/10/2006   DENTAL CARIES 08/10/2006   TOBACCO USE, QUIT 08/10/2006   PNEUMOCYSTIS PNEUMONIA 10/29/2004    Patient's Medications  New Prescriptions   No medications on file  Previous Medications   AMLODIPINE (NORVASC) 5 MG TABLET    Take 1 tablet (5 mg total) by mouth daily. At bedtime to lower cholesterol   FISH OIL-OMEGA-3 FATTY ACIDS 1000 MG CAPSULE    Take 3 g by mouth daily.   LORATADINE (CLARITIN) 10 MG TABLET    Take 10 mg by mouth daily.   SULFAMETHOXAZOLE-TRIMETHOPRIM (BACTRIM DS) 800-160 MG TABLET    Take 1 tablet by mouth daily.  Modified Medications   Modified Medication Previous Medication   ELVITEGRAVIR-COBICISTAT-EMTRICITABINE-TENOFOVIR (GENVOYA) 150-150-200-10 MG TABS TABLET elvitegravir-cobicistat-emtricitabine-tenofovir (GENVOYA) 150-150-200-10 MG TABS tablet      Take 1 tablet by mouth daily with breakfast.    Take 1 tablet by mouth daily with breakfast.  Discontinued Medications   No medications on file    Subjective: Edwin [name] is in for his routine HIV follow-up visit.  He had stopped taking his Genvoya earlier this year when he was concerned about hair loss.  His viral load went up to over 600,000 and his CD4 count dropped to 189.  After restarting Genvoya in March he has not missed any more doses.  He is feeling well other than having some slight tingling in his fingers of both hands.  It is most noticeable when he first gets in bed at night.  He has not noted any weakness.  Review of Systems: Review of Systems   Constitutional:  Negative for fever and weight loss.  Neurological:  Positive for sensory change. Negative for focal weakness.    Past Medical History:  Diagnosis Date   HIV infection ([place])    Hyperlipidemia     Social History   Tobacco Use   Smoking status: Former    Years: 12.00    Types: Cigarettes    Quit date: 07/31/2004    Years since quitting: 17.8   Smokeless tobacco: Never  Vaping Use   Vaping Use: Never used  Substance Use Topics   Alcohol use: Not Currently    Alcohol/week: 1.0 standard drink of alcohol    Types: 1 Standard drinks or equivalent per week    Comment: wine   Drug use: No    Family History  Problem Relation Age of Onset   Hypertension Father    Cancer Mother     No Known Allergies  Health Maintenance  Topic Date Due   COVID-19 Vaccine (1) Never done   Zoster Vaccines- Shingrix (1 of 2) Never done   COLONOSCOPY (Pts 45-18yrs Insurance coverage will need to be confirmed)  Never done   INFLUENZA VACCINE  02/28/2022   TETANUS/TDAP  10/26/2022   Hepatitis C Screening  Completed   HIV Screening  Completed   HPV VACCINES  Aged Out    Objective:  Vitals:   06/13/22 1550  BP: Marland Kitchen)  150/70  Pulse: 77  Temp: 97.9 F (36.6 C)  TempSrc: Temporal  SpO2: 97%  Weight: 208 lb (94.3 kg)  Height: 5\' 8"  (1.727 m)   Body mass index is 31.63 kg/m.  Physical Exam Constitutional:      Comments: He is in good spirits.  Cardiovascular:     Rate and Rhythm: Normal rate and regular rhythm.     Heart sounds: No murmur heard. Pulmonary:     Effort: Pulmonary effort is normal.     Breath sounds: Normal breath sounds.  Neurological:     Motor: No weakness.  Psychiatric:        Mood and Affect: Mood normal.     Lab Results Lab Results  Component Value Date   WBC 5.0 05/30/2022   HGB 13.9 05/30/2022   HCT 39.7 05/30/2022   MCV 93.9 05/30/2022   PLT 182 05/30/2022    Lab Results  Component Value Date   CREATININE 1.05 05/30/2022   BUN 12  05/30/2022   NA 140 05/30/2022   K 4.4 05/30/2022   CL 105 05/30/2022   CO2 28 05/30/2022    Lab Results  Component Value Date   ALT 20 05/30/2022   AST 19 05/30/2022   ALKPHOS 96 10/12/2016   BILITOT 0.4 05/30/2022    Lab Results  Component Value Date   CHOL 187 10/19/2021   HDL 36 (L) 10/19/2021   LDLCALC  10/19/2021     Comment:     . LDL cholesterol not calculated. Triglyceride levels greater than 400 mg/dL invalidate calculated LDL results. . Reference range: <100 . Desirable range <100 mg/dL for primary prevention;   <70 mg/dL for patients with CHD or diabetic patients  with > or = 2 CHD risk factors. 10/21/2021 LDL-C is now calculated using the Martin-Hopkins  calculation, which is a validated novel method providing  better accuracy than the Friedewald equation in the  estimation of LDL-C.  Marland Kitchen et al. Horald Pollen. Lenox Ahr): 2061-2068  (http://education.[facility].com/faq/FAQ164)    TRIG 444 (H) 10/19/2021   CHOLHDL 5.2 (H) 10/19/2021   Lab Results  Component Value Date   LABRPR NON-REACTIVE 10/19/2021   HIV 1 RNA Quant  Date Value  05/30/2022 58 Copies/mL (H)  12/13/2021 40 copies/mL (H)  10/19/2021 624,000 Copies/mL (H)   CD4 T Cell Abs (/uL)  Date Value  05/30/2022 564  12/13/2021 439  10/19/2021 184 (L)     Problem List Items Addressed This Visit       High   Human immunodeficiency virus (HIV) disease (HCC) - Primary    His infection has come back under good control since restarting Genvoya earlier this year.  He will continue Genvoya and follow-up after lab work in 6 months.  I encouraged him to take an annual influenza vaccine and an updated COVID-vaccine.  He said that he will consider it.      Relevant Medications   elvitegravir-cobicistat-emtricitabine-tenofovir (GENVOYA) 150-150-200-10 MG TABS tablet   Other Relevant Orders   T-helper cells (CD4) count (not at [facility])   HIV-1 RNA quant-no reflex-bld   CBC   Comprehensive metabolic  panel   Lipid panel   RPR      [facility], MD [facility] for Infectious Disease [facility] [phone] pager   [phone] cell 06/13/2022, 4:13 PM

## 2022-06-13 NOTE — Assessment & Plan Note (Signed)
His infection has come back under good control since restarting Genvoya earlier this year.  He will continue Genvoya and follow-up after lab work in 6 months.  I encouraged him to take an annual influenza vaccine and an updated COVID-vaccine.  He said that he will consider it.

## 2022-07-04 ENCOUNTER — Other Ambulatory Visit (HOSPITAL_COMMUNITY): Payer: Self-pay

## 2022-07-06 ENCOUNTER — Other Ambulatory Visit (HOSPITAL_COMMUNITY): Payer: Self-pay

## 2022-07-10 ENCOUNTER — Other Ambulatory Visit (HOSPITAL_COMMUNITY): Payer: Self-pay

## 2022-07-14 ENCOUNTER — Other Ambulatory Visit (HOSPITAL_COMMUNITY): Payer: Self-pay

## 2022-07-14 ENCOUNTER — Other Ambulatory Visit: Payer: Self-pay

## 2022-07-18 ENCOUNTER — Other Ambulatory Visit (HOSPITAL_COMMUNITY): Payer: Self-pay

## 2022-07-18 ENCOUNTER — Other Ambulatory Visit: Payer: Self-pay

## 2022-08-09 ENCOUNTER — Other Ambulatory Visit (HOSPITAL_COMMUNITY): Payer: Self-pay

## 2022-08-11 ENCOUNTER — Other Ambulatory Visit (HOSPITAL_COMMUNITY): Payer: Self-pay

## 2022-08-14 ENCOUNTER — Other Ambulatory Visit (HOSPITAL_COMMUNITY): Payer: Self-pay

## 2022-09-06 ENCOUNTER — Other Ambulatory Visit: Payer: Self-pay

## 2022-09-06 ENCOUNTER — Other Ambulatory Visit (HOSPITAL_COMMUNITY): Payer: Self-pay

## 2022-09-12 ENCOUNTER — Other Ambulatory Visit (HOSPITAL_COMMUNITY): Payer: Self-pay

## 2022-10-04 ENCOUNTER — Other Ambulatory Visit (HOSPITAL_COMMUNITY): Payer: Self-pay

## 2022-10-10 ENCOUNTER — Other Ambulatory Visit (HOSPITAL_COMMUNITY): Payer: Self-pay

## 2022-10-11 ENCOUNTER — Other Ambulatory Visit (HOSPITAL_COMMUNITY): Payer: Self-pay

## 2022-11-07 ENCOUNTER — Other Ambulatory Visit (HOSPITAL_COMMUNITY): Payer: Self-pay

## 2022-11-07 ENCOUNTER — Other Ambulatory Visit: Payer: Commercial Managed Care - PPO

## 2022-11-07 ENCOUNTER — Other Ambulatory Visit: Payer: Self-pay

## 2022-11-07 ENCOUNTER — Ambulatory Visit: Payer: Commercial Managed Care - PPO

## 2022-11-07 ENCOUNTER — Telehealth: Payer: Self-pay

## 2022-11-07 DIAGNOSIS — B20 Human immunodeficiency virus [HIV] disease: Secondary | ICD-10-CM

## 2022-11-07 NOTE — Telephone Encounter (Signed)
[facility] Patient Advocate Encounter   Received notification from [employer] that prior authorization for Edwin [name] is required.   PA submitted on 11/07/22 Key [id number] Status is pending  I had to start PA @ [email or web address]    [facility] will continue to follow.   Clearance Coots, CPhT Specialty Pharmacy Patient [facility] for Infectious Disease Phone: [phone] Fax:  [phone]

## 2022-11-08 LAB — T-HELPER CELLS (CD4) COUNT (NOT AT ARMC)
CD4 % Helper T Cell: 45 % (ref 33–65)
CD4 T Cell Abs: 673 /uL (ref 400–1790)

## 2022-11-11 LAB — CBC
HCT: 40.9 % (ref 38.5–50.0)
Hemoglobin: 14 g/dL (ref 13.2–17.1)
MCH: 31.7 pg (ref 27.0–33.0)
MCHC: 34.2 g/dL (ref 32.0–36.0)
MCV: 92.5 fL (ref 80.0–100.0)
MPV: 10 fL (ref 7.5–12.5)
Platelets: 202 10*3/uL (ref 140–400)
RBC: 4.42 10*6/uL (ref 4.20–5.80)
RDW: 14 % (ref 11.0–15.0)
WBC: 5 10*3/uL (ref 3.8–10.8)

## 2022-11-11 LAB — RPR: RPR Ser Ql: NONREACTIVE

## 2022-11-11 LAB — COMPREHENSIVE METABOLIC PANEL
AG Ratio: 1.6 (calc) (ref 1.0–2.5)
ALT: 14 U/L (ref 9–46)
AST: 18 U/L (ref 10–35)
Albumin: 4.3 g/dL (ref 3.6–5.1)
Alkaline phosphatase (APISO): 107 U/L (ref 35–144)
BUN: 13 mg/dL (ref 7–25)
CO2: 24 mmol/L (ref 20–32)
Calcium: 9.6 mg/dL (ref 8.6–10.3)
Chloride: 106 mmol/L (ref 98–110)
Creat: 1.12 mg/dL (ref 0.70–1.35)
Globulin: 2.7 g/dL (calc) (ref 1.9–3.7)
Glucose, Bld: 127 mg/dL — ABNORMAL HIGH (ref 65–99)
Potassium: 4 mmol/L (ref 3.5–5.3)
Sodium: 140 mmol/L (ref 135–146)
Total Bilirubin: 0.3 mg/dL (ref 0.2–1.2)
Total Protein: 7 g/dL (ref 6.1–8.1)

## 2022-11-11 LAB — LIPID PANEL
Cholesterol: 263 mg/dL — ABNORMAL HIGH (ref <200)
HDL: 51 mg/dL (ref >=40)
LDL Cholesterol (Calc): 153 mg/dL (calc) — ABNORMAL HIGH
Non-HDL Cholesterol (Calc): 212 mg/dL (calc) — ABNORMAL HIGH (ref <130)
Total CHOL/HDL Ratio: 5.2 (calc) — ABNORMAL HIGH (ref <5.0)
Triglycerides: 386 mg/dL — ABNORMAL HIGH (ref <150)

## 2022-11-11 LAB — HIV-1 RNA QUANT-NO REFLEX-BLD
HIV 1 RNA Quant: 65 Copies/mL — ABNORMAL HIGH
HIV-1 RNA Quant, Log: 1.82 Log cps/mL — ABNORMAL HIGH

## 2022-11-21 ENCOUNTER — Ambulatory Visit (INDEPENDENT_AMBULATORY_CARE_PROVIDER_SITE_OTHER): Payer: Commercial Managed Care - PPO | Admitting: Internal Medicine

## 2022-11-21 ENCOUNTER — Other Ambulatory Visit (HOSPITAL_COMMUNITY): Payer: Self-pay

## 2022-11-21 ENCOUNTER — Encounter: Payer: Self-pay | Admitting: Internal Medicine

## 2022-11-21 ENCOUNTER — Other Ambulatory Visit: Payer: Self-pay

## 2022-11-21 VITALS — BP 174/82 | HR 77 | Temp 98.3°F | Ht 68.0 in | Wt 220.0 lb

## 2022-11-21 DIAGNOSIS — B20 Human immunodeficiency virus [HIV] disease: Secondary | ICD-10-CM

## 2022-11-21 MED ORDER — GENVOYA 150-150-200-10 MG PO TABS
1.0000 | ORAL_TABLET | Freq: Every day | ORAL | 11 refills | Status: DC
Start: 2022-11-21 — End: 2023-01-08
  Filled 2022-11-21 – 2022-12-11 (×3): qty 30, 30d supply, fill #0
  Filled 2023-01-02: qty 30, 30d supply, fill #1

## 2022-11-21 NOTE — Assessment & Plan Note (Signed)
His infection came back under good control shortly after he restarted Genvoya 1 year ago.  He has had complete CD4 reconstitution.  He will continue Genvoya and follow-up after lab work in 1 year.

## 2022-11-21 NOTE — Progress Notes (Signed)
Patient Active Problem List   Diagnosis Date Noted   Human immunodeficiency virus (HIV) disease 08/10/2006    Priority: High   Hyperglycemia 08/03/2014    Priority: Medium    Obesity 11/23/2008    Priority: Medium    Dyslipidemia 08/10/2006    Priority: Medium    Hordeolum 05/20/2013   Hearing loss in right ear 01/03/2012   Seasonal allergies 01/03/2011   GYNECOMASTIA 08/29/2006   HEPATITIS B 08/10/2006   VENEREAL WART 08/10/2006   DENTAL CARIES 08/10/2006   TOBACCO USE, QUIT 08/10/2006   PNEUMOCYSTIS PNEUMONIA 10/29/2004    Patient's Medications  New Prescriptions   No medications on file  Previous Medications   AMLODIPINE (NORVASC) 5 MG TABLET    Take 1 tablet (5 mg total) by mouth daily. At bedtime to lower cholesterol   FISH OIL-OMEGA-3 FATTY ACIDS 1000 MG CAPSULE    Take 3 g by mouth daily.   LORATADINE (CLARITIN) 10 MG TABLET    Take 10 mg by mouth daily.   SULFAMETHOXAZOLE-TRIMETHOPRIM (BACTRIM DS) 800-160 MG TABLET    Take 1 tablet by mouth daily.  Modified Medications   Modified Medication Previous Medication   ELVITEGRAVIR-COBICISTAT-EMTRICITABINE-TENOFOVIR (GENVOYA) 150-150-200-10 MG TABS TABLET elvitegravir-cobicistat-emtricitabine-tenofovir (GENVOYA) 150-150-200-10 MG TABS tablet      Take 1 tablet by mouth daily with breakfast.    Take 1 tablet by mouth daily with breakfast.  Discontinued Medications   No medications on file    Subjective: Derwood is in for his routine HIV follow-up visit.  He denies any problems obtaining, taking or tolerating his Genvoya since he restarted it 1 year ago.  He takes it each morning with breakfast and does not recall missing doses.  He is feeling well other than some mild low back pain.  Review of Systems: Review of Systems  Constitutional:  Negative for weight loss.  Musculoskeletal:  Positive for back pain.  Psychiatric/Behavioral:  Negative for depression.     Past Medical History:  Diagnosis Date   HIV  infection    Hyperlipidemia     Social History   Tobacco Use   Smoking status: Former    Years: 12    Types: Cigarettes    Quit date: 07/31/2004    Years since quitting: 18.3   Smokeless tobacco: Never  Vaping Use   Vaping Use: Never used  Substance Use Topics   Alcohol use: Not Currently    Alcohol/week: 1.0 standard drink of alcohol    Types: 1 Standard drinks or equivalent per week    Comment: wine   Drug use: No    Family History  Problem Relation Age of Onset   Hypertension Father    Cancer Mother     No Known Allergies  Health Maintenance  Topic Date Due   COVID-19 Vaccine (1) Never done   Zoster Vaccines- Shingrix (1 of 2) Never done   COLONOSCOPY (Pts 45-3yrs Insurance coverage will need to be confirmed)  Never done   DTaP/Tdap/Td (2 - Td or Tdap) 10/26/2022   INFLUENZA VACCINE  03/01/2023   Hepatitis C Screening  Completed   HIV Screening  Completed   HPV VACCINES  Aged Out    Objective:  Vitals:   11/21/22 1531 11/21/22 1550  BP: (!) 162/81 (!) 172/84  Pulse: 77   Temp: 98.3 F (36.8 C)   TempSrc: Temporal   SpO2: 98%   Weight: 220 lb (99.8 kg)   Height:  (1.727  m)    Body mass index is 33.45 kg/m.  Physical Exam Constitutional:      Comments: He is in good spirits as usual.  Cardiovascular:     Rate and Rhythm: Normal rate and regular rhythm.     Heart sounds: No murmur heard. Pulmonary:     Effort: Pulmonary effort is normal.     Breath sounds: Normal breath sounds.  Psychiatric:        Mood and Affect: Mood normal.     Lab Results Lab Results  Component Value Date   WBC 5.0 11/07/2022   HGB 14.0 11/07/2022   HCT 40.9 11/07/2022   MCV 92.5 11/07/2022   PLT 202 11/07/2022    Lab Results  Component Value Date   CREATININE 1.12 11/07/2022   BUN 13 11/07/2022   NA 140 11/07/2022   K 4.0 11/07/2022   CL 106 11/07/2022   CO2 24 11/07/2022    Lab Results  Component Value Date   ALT 14 11/07/2022   AST 18 11/07/2022    ALKPHOS 96 10/12/2016   BILITOT 0.3 11/07/2022    Lab Results  Component Value Date   CHOL 263 (H) 11/07/2022   HDL 51 11/07/2022   LDLCALC 153 (H) 11/07/2022   TRIG 386 (H) 11/07/2022   CHOLHDL 5.2 (H) 11/07/2022   Lab Results  Component Value Date   LABRPR NON-REACTIVE 11/07/2022   HIV 1 RNA Quant  Date Value  11/07/2022 65 Copies/mL (H)  05/30/2022 58 Copies/mL (H)  12/13/2021 40 copies/mL (H)   CD4 T Cell Abs (/uL)  Date Value  11/07/2022 673  05/30/2022 564  12/13/2021 439     Problem List Items Addressed This Visit       High   Human immunodeficiency virus (HIV) disease - Primary    His infection came back under good control shortly after he restarted Genvoya 1 year ago.  He has had complete CD4 reconstitution.  He will continue Genvoya and follow-up after lab work in 1 year.      Relevant Medications   elvitegravir-cobicistat-emtricitabine-tenofovir (GENVOYA) 150-150-200-10 MG TABS tablet   Other Relevant Orders   CBC   T-helper cells (CD4) count (not at [facility])   Comprehensive metabolic panel   Lipid panel   RPR   HIV-1 RNA quant-no reflex-bld      Cliffton Asters, MD [facility] for Infectious Disease [facility] [phone] pager   [phone] cell 11/21/2022, 3:50 PM

## 2022-11-30 ENCOUNTER — Other Ambulatory Visit (HOSPITAL_COMMUNITY): Payer: Self-pay

## 2022-11-30 ENCOUNTER — Other Ambulatory Visit: Payer: Self-pay

## 2022-12-05 ENCOUNTER — Other Ambulatory Visit: Payer: Commercial Managed Care - PPO

## 2022-12-05 ENCOUNTER — Other Ambulatory Visit: Payer: Self-pay

## 2022-12-06 ENCOUNTER — Other Ambulatory Visit (HOSPITAL_COMMUNITY): Payer: Self-pay

## 2022-12-07 ENCOUNTER — Telehealth: Payer: Self-pay

## 2022-12-07 ENCOUNTER — Other Ambulatory Visit (HOSPITAL_COMMUNITY): Payer: Self-pay

## 2022-12-07 NOTE — Telephone Encounter (Signed)
[facility] Patient Advocate Encounter  Prior Authorization for Edwin [name] has been approved.    PA# [id number] Effective dates: 11/16/21 through 07/30/98  Patient will have to call insurance [phone] to sign up for the assistance group with his plan.  [facility] will continue to follow.  Clearance Coots, CPhT Specialty Pharmacy Patient [facility] for Infectious Disease Phone: [phone] Fax:  [phone]

## 2022-12-11 ENCOUNTER — Other Ambulatory Visit (HOSPITAL_COMMUNITY): Payer: Self-pay

## 2022-12-11 ENCOUNTER — Other Ambulatory Visit: Payer: Self-pay

## 2022-12-19 ENCOUNTER — Ambulatory Visit: Payer: Self-pay | Admitting: Internal Medicine

## 2023-01-02 ENCOUNTER — Other Ambulatory Visit (HOSPITAL_COMMUNITY): Payer: Self-pay

## 2023-01-02 ENCOUNTER — Other Ambulatory Visit: Payer: Self-pay

## 2023-01-03 DIAGNOSIS — B20 Human immunodeficiency virus [HIV] disease: Secondary | ICD-10-CM

## 2023-01-05 NOTE — Telephone Encounter (Signed)
Attempted to call patient regarding fax for refill request. Fax is from [facility]. Will need patient to confirm which pharmacy they are using for Genvoya.  [facility] can only accept faxed prescriptions. F: [phone] Juanita Laster, RMA

## 2023-01-08 MED ORDER — GENVOYA 150-150-200-10 MG PO TABS
1.0000 | ORAL_TABLET | Freq: Every day | ORAL | 9 refills | Status: DC
Start: 2023-01-08 — End: 2023-01-25

## 2023-01-08 NOTE — Telephone Encounter (Signed)
Patient returned call, he would like his Genvoya sent to [facility]. Prescription must be faxed. Rx printed and placed in provider's box for signature.   Sandie Ano, RN

## 2023-01-08 NOTE — Addendum Note (Signed)
Addended by: Linna Hoff D on: 01/08/2023 04:56 PM   Modules accepted: Orders

## 2023-01-08 NOTE — Telephone Encounter (Signed)
Received call from [facility], notified them that we are waiting on approval from patient to switch pharmacy.   Sandie Ano, RN

## 2023-01-09 ENCOUNTER — Other Ambulatory Visit: Payer: Self-pay | Admitting: Internal Medicine

## 2023-01-09 ENCOUNTER — Other Ambulatory Visit (HOSPITAL_COMMUNITY): Payer: Self-pay

## 2023-01-09 DIAGNOSIS — B20 Human immunodeficiency virus [HIV] disease: Secondary | ICD-10-CM

## 2023-01-10 NOTE — Telephone Encounter (Signed)
Rx faxed to [facility]. Received confirmation that fax went through. Juanita Laster, RMA

## 2023-01-25 ENCOUNTER — Other Ambulatory Visit: Payer: Self-pay

## 2023-01-25 DIAGNOSIS — B20 Human immunodeficiency virus [HIV] disease: Secondary | ICD-10-CM

## 2023-01-25 MED ORDER — GENVOYA 150-150-200-10 MG PO TABS
1.0000 | ORAL_TABLET | Freq: Every day | ORAL | 9 refills | Status: DC
Start: 2023-01-25 — End: 2023-10-19

## 2023-02-09 ENCOUNTER — Other Ambulatory Visit (HOSPITAL_COMMUNITY): Payer: Self-pay

## 2023-10-19 ENCOUNTER — Other Ambulatory Visit: Payer: Self-pay | Admitting: Internal Medicine

## 2023-10-19 DIAGNOSIS — B20 Human immunodeficiency virus [HIV] disease: Secondary | ICD-10-CM

## 2023-10-30 ENCOUNTER — Other Ambulatory Visit: Payer: Self-pay

## 2023-10-30 DIAGNOSIS — Z79899 Other long term (current) drug therapy: Secondary | ICD-10-CM

## 2023-10-30 DIAGNOSIS — B20 Human immunodeficiency virus [HIV] disease: Secondary | ICD-10-CM

## 2023-10-30 DIAGNOSIS — Z113 Encounter for screening for infections with a predominantly sexual mode of transmission: Secondary | ICD-10-CM

## 2023-11-06 ENCOUNTER — Other Ambulatory Visit: Payer: Self-pay

## 2023-11-06 DIAGNOSIS — Z79899 Other long term (current) drug therapy: Secondary | ICD-10-CM

## 2023-11-06 DIAGNOSIS — Z113 Encounter for screening for infections with a predominantly sexual mode of transmission: Secondary | ICD-10-CM

## 2023-11-06 DIAGNOSIS — B20 Human immunodeficiency virus [HIV] disease: Secondary | ICD-10-CM

## 2023-11-07 LAB — T-HELPER CELL (CD4) - (RCID CLINIC ONLY)
CD4 % Helper T Cell: 48 % (ref 33–65)
CD4 T Cell Abs: 494 /uL (ref 400–1790)

## 2023-11-07 LAB — URINE CYTOLOGY ANCILLARY ONLY
Chlamydia: NEGATIVE
Comment: NEGATIVE
Comment: NORMAL
Neisseria Gonorrhea: NEGATIVE

## 2023-11-08 LAB — COMPLETE METABOLIC PANEL WITHOUT GFR
AG Ratio: 1.9 (calc) (ref 1.0–2.5)
ALT: 17 U/L (ref 9–46)
AST: 20 U/L (ref 10–35)
Albumin: 4.4 g/dL (ref 3.6–5.1)
Alkaline phosphatase (APISO): 102 U/L (ref 35–144)
BUN: 12 mg/dL (ref 7–25)
CO2: 27 mmol/L (ref 20–32)
Calcium: 9.5 mg/dL (ref 8.6–10.3)
Chloride: 104 mmol/L (ref 98–110)
Creat: 1.1 mg/dL (ref 0.70–1.35)
Globulin: 2.3 g/dL (ref 1.9–3.7)
Glucose, Bld: 134 mg/dL — ABNORMAL HIGH (ref 65–99)
Potassium: 4.2 mmol/L (ref 3.5–5.3)
Sodium: 140 mmol/L (ref 135–146)
Total Bilirubin: 0.3 mg/dL (ref 0.2–1.2)
Total Protein: 6.7 g/dL (ref 6.1–8.1)

## 2023-11-08 LAB — LIPID PANEL
Cholesterol: 242 mg/dL — ABNORMAL HIGH (ref <200)
HDL: 53 mg/dL (ref >=40)
Non-HDL Cholesterol (Calc): 189 mg/dL — ABNORMAL HIGH (ref <130)
Total CHOL/HDL Ratio: 4.6 (calc) (ref <5.0)
Triglycerides: 429 mg/dL — ABNORMAL HIGH (ref <150)

## 2023-11-08 LAB — CBC WITH DIFFERENTIAL/PLATELET
Absolute Lymphocytes: 1176 {cells}/uL (ref 850–3900)
Absolute Monocytes: 583 {cells}/uL (ref 200–950)
Basophils Absolute: 29 {cells}/uL (ref 0–200)
Basophils Relative: 0.6 %
Eosinophils Absolute: 279 {cells}/uL (ref 15–500)
Eosinophils Relative: 5.7 %
HCT: 39.8 % (ref 38.5–50.0)
Hemoglobin: 13.7 g/dL (ref 13.2–17.1)
MCH: 31.8 pg (ref 27.0–33.0)
MCHC: 34.4 g/dL (ref 32.0–36.0)
MCV: 92.3 fL (ref 80.0–100.0)
MPV: 10.1 fL (ref 7.5–12.5)
Monocytes Relative: 11.9 %
Neutro Abs: 2832 {cells}/uL (ref 1500–7800)
Neutrophils Relative %: 57.8 %
Platelets: 176 10*3/uL (ref 140–400)
RBC: 4.31 10*6/uL (ref 4.20–5.80)
RDW: 13.8 % (ref 11.0–15.0)
Total Lymphocyte: 24 %
WBC: 4.9 10*3/uL (ref 3.8–10.8)

## 2023-11-08 LAB — HIV-1 RNA QUANT-NO REFLEX-BLD
HIV 1 RNA Quant: 75 {copies}/mL — ABNORMAL HIGH
HIV-1 RNA Quant, Log: 1.88 {Log_copies}/mL — ABNORMAL HIGH

## 2023-11-08 LAB — RPR: RPR Ser Ql: NONREACTIVE

## 2023-11-20 ENCOUNTER — Ambulatory Visit (INDEPENDENT_AMBULATORY_CARE_PROVIDER_SITE_OTHER): Payer: Self-pay | Admitting: Internal Medicine

## 2023-11-20 ENCOUNTER — Encounter: Payer: Self-pay | Admitting: Internal Medicine

## 2023-11-20 ENCOUNTER — Other Ambulatory Visit: Payer: Self-pay

## 2023-11-20 DIAGNOSIS — B20 Human immunodeficiency virus [HIV] disease: Secondary | ICD-10-CM

## 2023-11-20 DIAGNOSIS — Z21 Asymptomatic human immunodeficiency virus [HIV] infection status: Secondary | ICD-10-CM

## 2023-11-20 MED ORDER — GENVOYA 150-150-200-10 MG PO TABS: 1.0000 | ORAL_TABLET | Freq: Every day | ORAL | 5 refills | Status: DC

## 2023-11-20 MED ORDER — ATORVASTATIN CALCIUM 20 MG PO TABS: 20.0000 mg | ORAL_TABLET | Freq: Every day | ORAL | 5 refills | Status: DC

## 2023-11-20 NOTE — Patient Instructions (Signed)
 Start atorvastatin . Stop by lab in one month for cmp Continue genvoya  F/u in 6 months

## 2023-11-20 NOTE — Progress Notes (Signed)
 [facility]     HPI: Edwin [name] is a 63 y.o. male presents for HIV management.previously sen by Dr. Daina Drum. On genvoya . Missed 3 doses since last visit.  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 At bedtime to lower cholesterol (Patient not taking: Reported on 12/13/2021) 30 tablet 3   fish oil-omega-3 fatty acids 1000 MG capsule Take 3 g by mouth daily.     GENVOYA  150-150-200-10 MG TABS tablet TAKE 1 TABLET BY MOUTH DAILY WITH BREAKFAST 30 tablet 0   loratadine (CLARITIN) 10 MG tablet Take 10 mg by mouth daily.     sulfamethoxazole -trimethoprim  (BACTRIM  DS) 800-160 MG tablet Take 1 tablet by mouth daily. (Patient not taking: Reported on 06/13/2022) 30 tablet 5   No current facility-administered medications on file prior to visit.    No Known Allergies    Lab Results HIV 1 RNA Quant  Date Value  11/06/2023 75 copies/mL (H)  11/07/2022 65 Copies/mL (H)  05/30/2022 58 Copies/mL (H)   CD4 T Cell Abs (/uL)  Date Value  11/06/2023 494  11/07/2022 673  05/30/2022 564   No results found for: "HIV1GENOSEQ" Lab Results   Component Value Date   WBC 4.9 11/06/2023   HGB 13.7 11/06/2023   HCT 39.8 11/06/2023   MCV 92.3 11/06/2023   PLT 176 11/06/2023    Lab Results  Component Value Date   CREATININE 1.10 11/06/2023   BUN 12 11/06/2023   NA 140 11/06/2023   K 4.2 11/06/2023   CL 104 11/06/2023   CO2 27 11/06/2023   Lab Results  Component Value Date   ALT 17 11/06/2023   AST 20 11/06/2023   ALKPHOS 96 10/12/2016   BILITOT 0.3 11/06/2023    Lab Results  Component Value Date   CHOL 242 (H) 11/06/2023   TRIG 429 (H) 11/06/2023   HDL 53 11/06/2023   LDLCALC  11/06/2023     Comment:     . LDL cholesterol not calculated. Triglyceride levels greater than 400 mg/dL invalidate calculated LDL results. . Reference range: <100 . Desirable range <100 mg/dL for primary prevention;   <70 mg/dL for patients with CHD or diabetic patients  with > or = 2 CHD risk factors. Aaron Aas LDL-C is now calculated using the Martin-Hopkins  calculation, which is a validated novel method providing  better accuracy than the Friedewald equation in the  estimation of LDL-C.  Melinda Sprawls et al. Erroll Heard. 1610;960(45): 2061-2068  (http://education.[facility].com/faq/FAQ164)    No results found for: "HAV" Lab Results  Component Value Date   HEPBSAG NO 09/24/2006   HEPBSAB YES 09/24/2006   Lab  Results  Component Value Date   HCVAB NO 09/24/2006   Lab Results  Component Value Date   CHLAMYDIAWP Negative 11/06/2023   N Negative 11/06/2023   No results found for: "GCPROBEAPT" No results found for: "QUANTGOLD"  Assessment/Plan #HIV/Asymptomatic -CD4 494, VL75, on 11/06/23 on genvoya  -Reviewed labs -F/U in 6 months   #Vaccination COVID Flu Monkeypox PCV Meningitis HepA seorlogy nv HEpB seorlogy nv Tdap Shingles  #Health maintenance -Quantiferon NV -RPR 11/06/23 -HCV 2008 negative NV -GC urine 11/06/23 -Lipid The 10-year ASCVD risk score (Arnett DK, et al., 2019) is: 26.4%   Values used to calculate  the score:     Age: 88 years     Sex: Male     Is Non-Hispanic African American: Yes     Diabetic: No     Tobacco smoker: No     Systolic Blood Pressure: 171 mmHg     Is BP treated: Yes     HDL Cholesterol: 53 mg/dL     Total Cholesterol: 242 mg/dL  -Start atorvastatin  20 mg(start with low dose and titrate up to 40). CMP in one month, lab visit. -Colonoscopy->discuss furhter at next visit. Pt need sa colnoscpy->last one 20 years abo    Edwin Bjornstad, MD [facility]  I have personally spent 42 minutes involved in face-to-face and non-face-to-face activities for this patient on the day of the visit. Professional time spent includes the following activities: Preparing to see the patient (review of tests), Obtaining and/or reviewing separately obtained history (admission/discharge record), Performing a medically appropriate examination and/or evaluation , Ordering medications/tests/procedures, referring and communicating with other health care professionals, Documenting clinical information in the EMR, Independently interpreting results (not separately reported), Communicating results to the patient/family/caregiver, Counseling and educating the patient/family/caregiver and Care coordination (not separately reported).

## 2023-12-06 ENCOUNTER — Other Ambulatory Visit: Payer: Self-pay | Admitting: Internal Medicine

## 2023-12-06 DIAGNOSIS — B20 Human immunodeficiency virus [HIV] disease: Secondary | ICD-10-CM

## 2023-12-20 ENCOUNTER — Other Ambulatory Visit: Payer: Self-pay

## 2023-12-25 ENCOUNTER — Other Ambulatory Visit: Payer: Self-pay

## 2023-12-25 DIAGNOSIS — B20 Human immunodeficiency virus [HIV] disease: Secondary | ICD-10-CM

## 2023-12-25 LAB — COMPLETE METABOLIC PANEL WITHOUT GFR
AG Ratio: 1.8 (calc) (ref 1.0–2.5)
ALT: 16 U/L (ref 9–46)
AST: 17 U/L (ref 10–35)
Albumin: 4.6 g/dL (ref 3.6–5.1)
Alkaline phosphatase (APISO): 111 U/L (ref 35–144)
BUN: 12 mg/dL (ref 7–25)
CO2: 23 mmol/L (ref 20–32)
Calcium: 9.4 mg/dL (ref 8.6–10.3)
Chloride: 106 mmol/L (ref 98–110)
Creat: 1.13 mg/dL (ref 0.70–1.35)
Globulin: 2.5 g/dL (ref 1.9–3.7)
Glucose, Bld: 119 mg/dL — ABNORMAL HIGH (ref 65–99)
Potassium: 4 mmol/L (ref 3.5–5.3)
Sodium: 141 mmol/L (ref 135–146)
Total Bilirubin: 0.3 mg/dL (ref 0.2–1.2)
Total Protein: 7.1 g/dL (ref 6.1–8.1)

## 2024-05-21 ENCOUNTER — Ambulatory Visit: Payer: Self-pay | Admitting: Internal Medicine

## 2024-05-21 ENCOUNTER — Encounter: Payer: Self-pay | Admitting: Internal Medicine

## 2024-05-21 ENCOUNTER — Other Ambulatory Visit: Payer: Self-pay

## 2024-05-21 VITALS — BP 152/82 | HR 63 | Temp 98.4°F | Ht 68.0 in | Wt 208.0 lb

## 2024-05-21 DIAGNOSIS — B20 Human immunodeficiency virus [HIV] disease: Secondary | ICD-10-CM

## 2024-05-21 DIAGNOSIS — Z113 Encounter for screening for infections with a predominantly sexual mode of transmission: Secondary | ICD-10-CM

## 2024-05-21 MED ORDER — GENVOYA 150-150-200-10 MG PO TABS: 1.0000 | ORAL_TABLET | Freq: Every day | ORAL | 5 refills | Status: AC

## 2024-05-21 NOTE — Progress Notes (Addendum)
 [facility]     HPI: Edwin [name] is a 63 y.o. male presents for HIV management.On genvoya . Missed 3 doses since last visit.  No missed doses of art.  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     GENVOYA  150-150-200-10 MG TABS tablet TAKE 1 TABLET BY MOUTH DAILY WITH BREAKFAST 30 tablet 5   loratadine (CLARITIN) 10 MG tablet Take 10 mg by mouth daily.     No current facility-administered medications on file prior to visit.    No Known Allergies    Lab Results HIV 1 RNA Quant  Date Value  11/06/2023 75 copies/mL (H)  11/07/2022 65 Copies/mL (H)  05/30/2022 58 Copies/mL (H)   CD4 T Cell Abs (/uL)  Date Value  11/06/2023 494  11/07/2022 673  05/30/2022 564   No results found for: HIV1GENOSEQ Lab Results  Component Value Date   WBC 4.9 11/06/2023   HGB 13.7 11/06/2023   HCT 39.8 11/06/2023   MCV 92.3 11/06/2023   PLT 176 11/06/2023    Lab Results  Component Value Date   CREATININE 1.13 12/25/2023   BUN 12 12/25/2023   NA 141 12/25/2023   K 4.0 12/25/2023   CL 106 12/25/2023   CO2 23 12/25/2023   Lab Results   Component Value Date   ALT 16 12/25/2023   AST 17 12/25/2023   ALKPHOS 96 10/12/2016   BILITOT 0.3 12/25/2023    Lab Results  Component Value Date   CHOL 242 (H) 11/06/2023   TRIG 429 (H) 11/06/2023   HDL 53 11/06/2023   LDLCALC  11/06/2023     Comment:     . LDL cholesterol not calculated. Triglyceride levels greater than 400 mg/dL invalidate calculated LDL results. . Reference range: <100 . Desirable range <100 mg/dL for primary prevention;   <70 mg/dL for patients with CHD or diabetic patients  with > or = 2 CHD risk factors. SABRA LDL-C is now calculated using the Martin-Hopkins  calculation, which is a validated novel method providing  better accuracy than the Friedewald equation in the  estimation of LDL-C.  Gladis APPLETHWAITE et al. SANDREA. 7986;689(80): 2061-2068  (http://education.[facility].com/faq/FAQ164)    No results found for: HAV Lab Results  Component Value Date   HEPBSAG NO 09/24/2006   HEPBSAB YES 09/24/2006   Lab Results  Component Value Date   HCVAB NO 09/24/2006   Lab Results  Component Value Date   CHLAMYDIAWP Negative 11/06/2023   N Negative 11/06/2023   No results found for: GCPROBEAPT No results found for: QUANTGOLD  Assessment/Plan #HIV -CD4 494, VL75, on 11/06/23 on genvoya  -  labs today -F/U in 6 months     #Vaccination COVID-declined Flu-declined Monkeypox PCV Meningitis HepA seorlogy nv HEpB seorlogy nv Tdap Shingles   #Health maintenance -Quantiferon NV -RPR 11/06/23 -HCV 2008 negative NV -GC urine 11/06/23 -Lipid The 10-year ASCVD risk score (Arnett DK, et al., 2019) is: 26.4%   Values used to calculate the score:     Age: 74 years     Sex: Male     Is Non-Hispanic African American: Yes     Diabetic: No     Tobacco smoker: No     Systolic Blood Pressure: 171 mmHg     Is BP treated: Yes     HDL Cholesterol: 53 mg/dL     Total Cholesterol: 242 mg/dL  -does not take statin, he won't take it.   -Colonoscopy->discuss furhter at next visit. Pt need sa colnoscpy->last one 20 years abo       Loney Stank, MD [facility] I have personally spent 40 minutes involved in face-to-face and non-face-to-face activities for this patient on the day of the visit. Professional time spent includes the following activities: Preparing to see the patient (review of tests), Obtaining and/or reviewing separately obtained history , Performing a medically appropriate examination and/or evaluation , Ordering medications/tests/procedures, referring and communicating with other health care professionals, Documenting clinical information in the EMR, Independently interpreting results (not separately reported), Communicating results to the patient/, Counseling and educating the patien and Care coordination (not separately reported).

## 2024-05-21 NOTE — Patient Instructions (Signed)
 Lab visit one week prior to follow-up visit

## 2024-05-22 LAB — T-HELPER CELL (CD4) - (RCID CLINIC ONLY)
CD4 % Helper T Cell: 42 % (ref 33–65)
CD4 T Cell Abs: 618 /uL (ref 400–1790)

## 2024-05-23 LAB — CBC WITH DIFFERENTIAL/PLATELET
Absolute Lymphocytes: 1656 {cells}/uL (ref 850–3900)
Absolute Monocytes: 437 {cells}/uL (ref 200–950)
Basophils Absolute: 38 {cells}/uL (ref 0–200)
Basophils Relative: 0.8 %
Eosinophils Absolute: 317 {cells}/uL (ref 15–500)
Eosinophils Relative: 6.6 %
HCT: 41.2 % (ref 38.5–50.0)
Hemoglobin: 13.9 g/dL (ref 13.2–17.1)
MCH: 31.4 pg (ref 27.0–33.0)
MCHC: 33.7 g/dL (ref 32.0–36.0)
MCV: 93 fL (ref 80.0–100.0)
MPV: 10.1 fL (ref 7.5–12.5)
Monocytes Relative: 9.1 %
Neutro Abs: 2352 {cells}/uL (ref 1500–7800)
Neutrophils Relative %: 49 %
Platelets: 191 Thousand/uL (ref 140–400)
RBC: 4.43 Million/uL (ref 4.20–5.80)
RDW: 14.2 % (ref 11.0–15.0)
Total Lymphocyte: 34.5 %
WBC: 4.8 Thousand/uL (ref 3.8–10.8)

## 2024-05-23 LAB — COMPLETE METABOLIC PANEL WITHOUT GFR
AG Ratio: 2.1 (calc) (ref 1.0–2.5)
ALT: 16 U/L (ref 9–46)
AST: 19 U/L (ref 10–35)
Albumin: 4.4 g/dL (ref 3.6–5.1)
Alkaline phosphatase (APISO): 104 U/L (ref 35–144)
BUN: 13 mg/dL (ref 7–25)
CO2: 27 mmol/L (ref 20–32)
Calcium: 9.7 mg/dL (ref 8.6–10.3)
Chloride: 105 mmol/L (ref 98–110)
Creat: 1.23 mg/dL (ref 0.70–1.35)
Globulin: 2.1 g/dL (ref 1.9–3.7)
Glucose, Bld: 96 mg/dL (ref 65–99)
Potassium: 4.6 mmol/L (ref 3.5–5.3)
Sodium: 140 mmol/L (ref 135–146)
Total Bilirubin: 0.3 mg/dL (ref 0.2–1.2)
Total Protein: 6.5 g/dL (ref 6.1–8.1)

## 2024-05-23 LAB — HIV-1 RNA QUANT-NO REFLEX-BLD
HIV 1 RNA Quant: 23 {copies}/mL — ABNORMAL HIGH
HIV-1 RNA Quant, Log: 1.36 {Log_copies}/mL — ABNORMAL HIGH

## 2024-05-23 LAB — RPR: RPR Ser Ql: NONREACTIVE

## 2024-07-01 ENCOUNTER — Ambulatory Visit: Payer: Self-pay | Admitting: Family Medicine

## 2024-08-05 ENCOUNTER — Ambulatory Visit (INDEPENDENT_AMBULATORY_CARE_PROVIDER_SITE_OTHER): Payer: Self-pay | Admitting: Family Medicine

## 2024-08-05 ENCOUNTER — Encounter: Payer: Self-pay | Admitting: Family Medicine

## 2024-08-05 VITALS — BP 144/66 | HR 71 | Temp 98.5°F | Ht 68.0 in | Wt 211.0 lb

## 2024-08-05 DIAGNOSIS — Z1321 Encounter for screening for nutritional disorder: Secondary | ICD-10-CM

## 2024-08-05 DIAGNOSIS — Z1211 Encounter for screening for malignant neoplasm of colon: Secondary | ICD-10-CM

## 2024-08-05 DIAGNOSIS — Z130 Encounter for screening for diseases of the blood and blood-forming organs and certain disorders involving the immune mechanism: Secondary | ICD-10-CM

## 2024-08-05 DIAGNOSIS — Z136 Encounter for screening for cardiovascular disorders: Secondary | ICD-10-CM | POA: Diagnosis not present

## 2024-08-05 DIAGNOSIS — R030 Elevated blood-pressure reading, without diagnosis of hypertension: Secondary | ICD-10-CM

## 2024-08-05 DIAGNOSIS — Z1329 Encounter for screening for other suspected endocrine disorder: Secondary | ICD-10-CM | POA: Diagnosis not present

## 2024-08-05 DIAGNOSIS — B20 Human immunodeficiency virus [HIV] disease: Secondary | ICD-10-CM | POA: Diagnosis not present

## 2024-08-05 DIAGNOSIS — Z13228 Encounter for screening for other metabolic disorders: Secondary | ICD-10-CM

## 2024-08-05 LAB — BAYER DCA HB A1C WAIVED: HB A1C (BAYER DCA - WAIVED): 5.2 % (ref 4.8–5.6)

## 2024-08-05 NOTE — Progress Notes (Signed)
 "  Acute Office Visit  Patient ID: Edwin [name], male    DOB: 10-29-1960, 64 y.o.   MRN: [id number]  PCP: Alec House, MD  Chief Complaint  Patient presents with   Establish Care    Subjective:     HPI  Discussed the use of AI scribe software for clinical note transcription with the patient, who gave verbal consent to proceed.  History of Present Illness   Edwin [name] is a 64 year old male who presents for establishment of care and routine health maintenance.   Human immunodeficiency virus (hiv) infection - HIV is well-controlled with undetectable viral loads per patient.  - Followed by ID every 6 months.  - On ART and genvoya .   Hyperlipidemia - Elevated cholesterol noted on last check - Previously advised to start statin therapy but opted for dietary management, including increased juicing. - Previously declined statin therapy.   Elevated BP - Denies every being diagnosed with htn.   Rectal bleeding - Episodes of bloody stools occurred for about one month during this past summer. - No recurrence of bloody stools since summer. - No colonoscopy performed in approximately 30 years. - Open to scheduling colonoscopy.        ROS     Objective:    BP (!) 144/66 (Cuff Size: Normal)   Pulse 71   Temp 98.5 F (36.9 C)   Ht 5' 8 (1.727 m)   Wt 211 lb (95.7 kg)   SpO2 96%   BMI 32.08 kg/m    Physical Exam Vitals reviewed.  Constitutional:      Appearance: Normal appearance.  HENT:     Head: Normocephalic and atraumatic.  Eyes:     Extraocular Movements: Extraocular movements intact.     Conjunctiva/sclera: Conjunctivae normal.     Pupils: Pupils are equal, round, and reactive to light.  Cardiovascular:     Rate and Rhythm: Normal rate and regular rhythm.     Pulses: Normal pulses.     Heart sounds: Normal heart sounds. No murmur heard. Pulmonary:     Effort: Pulmonary effort is normal. No respiratory distress.     Breath sounds:  Normal breath sounds.  Musculoskeletal:        General: No deformity. Normal range of motion.     Cervical back: Normal range of motion.  Skin:    General: Skin is warm and dry.  Neurological:     General: No focal deficit present.     Mental Status: He is alert and oriented to person, place, and time.  Psychiatric:        Mood and Affect: Mood normal.        Behavior: Behavior normal.       No results found for any visits on 08/05/24.     Assessment & Plan:   Problem List Items Addressed This Visit       Other   Human immunodeficiency virus (HIV) disease (HCC) - Primary   Other Visit Diagnoses       Elevated blood pressure reading         Encounter for screening for cardiovascular disorders       Relevant Orders   Lipid Panel     Screening for endocrine, nutritional, metabolic and immunity disorder       Relevant Orders   Bayer DCA Hb A1c Waived     Screening for colon cancer       Relevant Orders   Amb Referral to Colonoscopy  Assessment and Plan  HIV  - Continue f/u with ID.  - Continue ART and genvoya .     Hyperlipidemia - Recheck lipid levels today, results pending.   Elevated BP - Obtain blood pressure machine and check daily x 1-2 weeks.  - Discussed how to properly measure BP. - Follow up through mychart or appointment with at home BP readings.   General Health Maintenance - Ordered colonoscopy for colorectal cancer screening. - Schedule yearly physical exam. - CBC and CMP from 05/21/24 unremarkable.         No orders of the defined types were placed in this encounter.   Return in about 3 months (around 11/03/2024) for physical .  Oneil LELON Severin, FNP [facility]   "

## 2024-08-06 ENCOUNTER — Ambulatory Visit: Payer: Self-pay | Admitting: Family Medicine

## 2024-08-06 LAB — LIPID PANEL
Chol/HDL Ratio: 5.3 ratio — ABNORMAL HIGH (ref 0.0–5.0)
Cholesterol, Total: 301 mg/dL — ABNORMAL HIGH (ref 100–199)
HDL: 57 mg/dL
LDL Chol Calc (NIH): 165 mg/dL — ABNORMAL HIGH (ref 0–99)
Triglycerides: 410 mg/dL — ABNORMAL HIGH (ref 0–149)
VLDL Cholesterol Cal: 79 mg/dL — ABNORMAL HIGH (ref 5–40)

## 2024-08-11 ENCOUNTER — Encounter (INDEPENDENT_AMBULATORY_CARE_PROVIDER_SITE_OTHER): Payer: Self-pay | Admitting: *Deleted

## 2024-11-12 ENCOUNTER — Other Ambulatory Visit: Payer: Self-pay

## 2024-11-19 ENCOUNTER — Ambulatory Visit: Payer: Self-pay | Admitting: Internal Medicine

## 2024-12-30 ENCOUNTER — Encounter: Admitting: Family Medicine
